# Patient Record
Sex: Male | Born: 1960 | Race: White | Hispanic: No | Marital: Married | Smoking: Never smoker
Health system: Southern US, Community
[De-identification: ages and names within clinical notes are randomized; demographics above are authoritative.]

## PROBLEM LIST (undated history)

## (undated) DIAGNOSIS — U071 COVID-19: Secondary | ICD-10-CM

## (undated) DIAGNOSIS — E785 Hyperlipidemia, unspecified: Secondary | ICD-10-CM

## (undated) DIAGNOSIS — K4090 Unilateral inguinal hernia, without obstruction or gangrene, not specified as recurrent: Secondary | ICD-10-CM

## (undated) DIAGNOSIS — R0902 Hypoxemia: Secondary | ICD-10-CM

## (undated) HISTORY — DX: Unilateral inguinal hernia, without obstruction or gangrene, not specified as recurrent: K40.90

## (undated) HISTORY — DX: COVID-19: U07.1

## (undated) HISTORY — DX: Hypoxemia: R09.02

## (undated) HISTORY — DX: Hyperlipidemia, unspecified: E78.5

---

## 2001-08-28 DIAGNOSIS — I4891 Unspecified atrial fibrillation: Secondary | ICD-10-CM

## 2001-08-28 HISTORY — PX: CARDIAC CATHETERIZATION: SHX172

## 2001-08-28 HISTORY — DX: Unspecified atrial fibrillation: I48.91

## 2001-11-05 ENCOUNTER — Inpatient Hospital Stay (HOSPITAL_COMMUNITY): Admission: AD | Admit: 2001-11-05 | Discharge: 2001-11-07 | Payer: Self-pay | Admitting: Cardiology

## 2010-09-30 LAB — HM COLONOSCOPY

## 2016-11-19 DIAGNOSIS — M79672 Pain in left foot: Secondary | ICD-10-CM | POA: Diagnosis not present

## 2017-03-20 DIAGNOSIS — Z1211 Encounter for screening for malignant neoplasm of colon: Secondary | ICD-10-CM | POA: Diagnosis not present

## 2017-03-20 DIAGNOSIS — I480 Paroxysmal atrial fibrillation: Secondary | ICD-10-CM | POA: Diagnosis not present

## 2017-03-20 DIAGNOSIS — E782 Mixed hyperlipidemia: Secondary | ICD-10-CM | POA: Diagnosis not present

## 2018-07-29 DIAGNOSIS — Z6828 Body mass index (BMI) 28.0-28.9, adult: Secondary | ICD-10-CM | POA: Diagnosis not present

## 2018-07-29 DIAGNOSIS — Z125 Encounter for screening for malignant neoplasm of prostate: Secondary | ICD-10-CM | POA: Diagnosis not present

## 2018-07-29 DIAGNOSIS — Z0000 Encounter for general adult medical examination without abnormal findings: Secondary | ICD-10-CM | POA: Diagnosis not present

## 2018-09-04 DIAGNOSIS — L821 Other seborrheic keratosis: Secondary | ICD-10-CM | POA: Diagnosis not present

## 2018-09-04 DIAGNOSIS — L570 Actinic keratosis: Secondary | ICD-10-CM | POA: Diagnosis not present

## 2018-09-04 DIAGNOSIS — L578 Other skin changes due to chronic exposure to nonionizing radiation: Secondary | ICD-10-CM | POA: Diagnosis not present

## 2018-09-04 DIAGNOSIS — L820 Inflamed seborrheic keratosis: Secondary | ICD-10-CM | POA: Diagnosis not present

## 2019-11-06 ENCOUNTER — Observation Stay (HOSPITAL_COMMUNITY): Payer: Commercial Managed Care - PPO

## 2019-11-06 ENCOUNTER — Inpatient Hospital Stay (HOSPITAL_COMMUNITY)
Admission: EM | Admit: 2019-11-06 | Discharge: 2019-11-08 | DRG: 177 | Disposition: A | Discharge status: Home / Self Care | Payer: Commercial Managed Care - PPO | Attending: Family Medicine | Admitting: Family Medicine

## 2019-11-06 ENCOUNTER — Emergency Department (HOSPITAL_COMMUNITY): Payer: Commercial Managed Care - PPO

## 2019-11-06 ENCOUNTER — Encounter (HOSPITAL_COMMUNITY): Payer: Self-pay | Admitting: Family Medicine

## 2019-11-06 DIAGNOSIS — U071 COVID-19: Secondary | ICD-10-CM

## 2019-11-06 DIAGNOSIS — R011 Cardiac murmur, unspecified: Secondary | ICD-10-CM | POA: Diagnosis present

## 2019-11-06 DIAGNOSIS — J9601 Acute respiratory failure with hypoxia: Secondary | ICD-10-CM | POA: Diagnosis present

## 2019-11-06 DIAGNOSIS — Z79899 Other long term (current) drug therapy: Secondary | ICD-10-CM

## 2019-11-06 DIAGNOSIS — J1282 Pneumonia due to coronavirus disease 2019: Secondary | ICD-10-CM

## 2019-11-06 DIAGNOSIS — N179 Acute kidney failure, unspecified: Secondary | ICD-10-CM | POA: Diagnosis present

## 2019-11-06 DIAGNOSIS — Z7982 Long term (current) use of aspirin: Secondary | ICD-10-CM

## 2019-11-06 DIAGNOSIS — R0902 Hypoxemia: Secondary | ICD-10-CM | POA: Insufficient documentation

## 2019-11-06 HISTORY — DX: Pneumonia due to coronavirus disease 2019: J12.82

## 2019-11-06 HISTORY — DX: COVID-19: U07.1

## 2019-11-06 LAB — COMPREHENSIVE METABOLIC PANEL
ALT: 17 U/L (ref 0–44)
AST: 28 U/L (ref 15–41)
Albumin: 2.8 g/dL — ABNORMAL LOW (ref 3.5–5.0)
Alkaline Phosphatase: 58 U/L (ref 38–126)
Anion gap: 11 (ref 5–15)
BUN: 19 mg/dL (ref 6–20)
CO2: 22 mmol/L (ref 22–32)
Calcium: 8.2 mg/dL — ABNORMAL LOW (ref 8.9–10.3)
Chloride: 102 mmol/L (ref 98–111)
Creatinine, Ser: 1.09 mg/dL (ref 0.61–1.24)
GFR calc Af Amer: >60 mL/min (ref >60)
GFR calc non Af Amer: >60 mL/min (ref >60)
Glucose, Bld: 130 mg/dL — ABNORMAL HIGH (ref 70–99)
Potassium: 3.9 mmol/L (ref 3.5–5.1)
Sodium: 135 mmol/L (ref 135–145)
Total Bilirubin: 1.2 mg/dL (ref 0.3–1.2)
Total Protein: 6.1 g/dL — ABNORMAL LOW (ref 6.5–8.1)

## 2019-11-06 LAB — CBC WITH DIFFERENTIAL/PLATELET
Abs Immature Granulocytes: 0.07 10*3/uL (ref 0.00–0.07)
Basophils Absolute: 0 10*3/uL (ref 0.0–0.1)
Basophils Relative: 0 %
Eosinophils Absolute: 0 10*3/uL (ref 0.0–0.5)
Eosinophils Relative: 0 %
HCT: 39.9 % (ref 39.0–52.0)
Hemoglobin: 13.7 g/dL (ref 13.0–17.0)
Immature Granulocytes: 1 %
Lymphocytes Relative: 6 %
Lymphs Abs: 0.6 10*3/uL — ABNORMAL LOW (ref 0.7–4.0)
MCH: 30 pg (ref 26.0–34.0)
MCHC: 34.3 g/dL (ref 30.0–36.0)
MCV: 87.3 fL (ref 80.0–100.0)
Monocytes Absolute: 0.6 10*3/uL (ref 0.1–1.0)
Monocytes Relative: 6 %
Neutro Abs: 8.5 10*3/uL — ABNORMAL HIGH (ref 1.7–7.7)
Neutrophils Relative %: 87 %
Platelets: 305 10*3/uL (ref 150–400)
RBC: 4.57 MIL/uL (ref 4.22–5.81)
RDW: 12.7 % (ref 11.5–15.5)
WBC: 9.7 10*3/uL (ref 4.0–10.5)
nRBC: 0 % (ref 0.0–0.2)

## 2019-11-06 LAB — TRIGLYCERIDES: Triglycerides: 118 mg/dL (ref <150)

## 2019-11-06 LAB — LACTIC ACID, PLASMA: Lactic Acid, Venous: 1.1 mmol/L (ref 0.5–1.9)

## 2019-11-06 LAB — POC SARS CORONAVIRUS 2 AG -  ED: SARS Coronavirus 2 Ag: NEGATIVE

## 2019-11-06 LAB — C-REACTIVE PROTEIN: CRP: 23.1 mg/dL — ABNORMAL HIGH (ref <1.0)

## 2019-11-06 LAB — FERRITIN: Ferritin: 635 ng/mL — ABNORMAL HIGH (ref 24–336)

## 2019-11-06 LAB — D-DIMER, QUANTITATIVE: D-Dimer, Quant: 1.07 ug/mL-FEU — ABNORMAL HIGH (ref 0.00–0.50)

## 2019-11-06 LAB — RESPIRATORY PANEL BY RT PCR (FLU A&B, COVID)
Influenza A by PCR: NEGATIVE
Influenza B by PCR: NEGATIVE
SARS Coronavirus 2 by RT PCR: POSITIVE — AB

## 2019-11-06 LAB — BRAIN NATRIURETIC PEPTIDE: B Natriuretic Peptide: 72.9 pg/mL (ref 0.0–100.0)

## 2019-11-06 LAB — HIV ANTIBODY (ROUTINE TESTING W REFLEX): HIV Screen 4th Generation wRfx: NONREACTIVE

## 2019-11-06 LAB — ABO/RH: ABO/RH(D): O POS

## 2019-11-06 LAB — PROCALCITONIN: Procalcitonin: 0.15 ng/mL

## 2019-11-06 LAB — TROPONIN I (HIGH SENSITIVITY)
Troponin I (High Sensitivity): 8 ng/L (ref <18)
Troponin I (High Sensitivity): 8 ng/L (ref <18)

## 2019-11-06 LAB — LACTATE DEHYDROGENASE: LDH: 281 U/L — ABNORMAL HIGH (ref 98–192)

## 2019-11-06 LAB — FIBRINOGEN: Fibrinogen: >800 mg/dL — ABNORMAL HIGH (ref 210–475)

## 2019-11-06 MED ORDER — DEXAMETHASONE 6 MG PO TABS
6.0000 mg | ORAL_TABLET | ORAL | Status: DC
  Administered 2019-11-06 – 2019-11-07 (×2): 6 mg via ORAL
  Filled 2019-11-06: qty 2
  Filled 2019-11-06: qty 1

## 2019-11-06 MED ORDER — POLYETHYLENE GLYCOL 3350 17 G PO PACK: 17.0000 g | PACK | Freq: Every day | ORAL | Status: DC | PRN

## 2019-11-06 MED ORDER — SODIUM CHLORIDE 0.9 % IV SOLN
INTRAVENOUS | Status: AC
  Administered 2019-11-06: 100 mL/h via INTRAVENOUS

## 2019-11-06 MED ORDER — SODIUM CHLORIDE 0.9 % IV SOLN: 200.0000 mg | Freq: Once | INTRAVENOUS | Status: DC

## 2019-11-06 MED ORDER — SODIUM CHLORIDE 0.9 % IV SOLN: 100.0000 mg | Freq: Every day | INTRAVENOUS | Status: DC

## 2019-11-06 MED ORDER — ACETAMINOPHEN 500 MG PO TABS
1000.0000 mg | ORAL_TABLET | Freq: Once | ORAL | Status: AC
  Administered 2019-11-06: 1000 mg via ORAL
  Filled 2019-11-06: qty 2

## 2019-11-06 MED ORDER — KETOROLAC TROMETHAMINE 15 MG/ML IJ SOLN
15.0000 mg | Freq: Once | INTRAMUSCULAR | Status: AC
  Administered 2019-11-06: 18:00:00 15 mg via INTRAVENOUS
  Filled 2019-11-06: qty 1

## 2019-11-06 MED ORDER — SODIUM CHLORIDE 0.9 % IV SOLN
100.0000 mg | Freq: Every day | INTRAVENOUS | Status: DC
  Administered 2019-11-07 – 2019-11-08 (×2): 100 mg via INTRAVENOUS
  Filled 2019-11-06 (×3): qty 20

## 2019-11-06 MED ORDER — ACETAMINOPHEN 325 MG PO TABS: 650.0000 mg | ORAL_TABLET | Freq: Four times a day (QID) | ORAL | Status: DC | PRN

## 2019-11-06 MED ORDER — SODIUM CHLORIDE 0.9 % IV SOLN
100.0000 mg | INTRAVENOUS | Status: AC
  Administered 2019-11-06: 20:00:00 100 mg via INTRAVENOUS
  Filled 2019-11-06 (×6): qty 20

## 2019-11-06 MED ORDER — HYDROCOD POLST-CPM POLST ER 10-8 MG/5ML PO SUER
5.0000 mL | Freq: Once | ORAL | Status: AC
  Administered 2019-11-06: 5 mL via ORAL
  Filled 2019-11-06: qty 5

## 2019-11-06 MED ORDER — IOHEXOL 350 MG/ML SOLN
75.0000 mL | Freq: Once | INTRAVENOUS | Status: AC | PRN
  Administered 2019-11-06: 75 mL via INTRAVENOUS

## 2019-11-06 MED ORDER — ENOXAPARIN SODIUM 40 MG/0.4ML ~~LOC~~ SOLN
40.0000 mg | SUBCUTANEOUS | Status: DC
  Administered 2019-11-06 – 2019-11-07 (×2): 40 mg via SUBCUTANEOUS
  Filled 2019-11-06 (×2): qty 0.4

## 2019-11-06 NOTE — ED Notes (Signed)
Updated pt's wife, Vaughan Basta, on pts condition.

## 2019-11-06 NOTE — H&P (Addendum)
[facility] Admission History and Physical Service Pager: [phone]  Patient name: Robert Arroyo Medical record number: [id number] Date of birth: Sep 19, 1960 Age: 59 y.o. Gender: male  Primary Care Provider: Rochel Brome, MD Consultants: None Code Status: Full  Preferred Emergency Contact: Balinda Quails, [phone]  Chief Complaint: SOB  Assessment and Plan: Robert Arroyo is a 59 y.o. male presenting with SOB and known COVID-19 infection. PMH is significant for heart murmur on metoprolol per patient report.  Acute hypoxic respiratory failure 2/2 COVID-19 Pneumonia Diagnosed positive on 3/8. Has been having intermittent fevers and coughing for a few weeks.  T max 103 at home.  Over the last 24 hrs has had worsening shortness of breath, describes it as a rather sudden onset.  Called EMS, who found patient to by hypoxic to 80s and was placed on 4L and brought to ED.  Febrile to 102 in ED with mild tachypnea to 20s.  Has been on RA satting at 88-low 90s in ED, has refused O2 at this time.  HR in 70s to 80s.  CXR bilateral parenchymal opacities consistent with COVID-19.  Covid labs with LDH 281, ferritin 635, CRP 23.1, D-dimer 1.07, fibrinogen get greater than 800.  Blood cultures collected.  Discussed with attending Dr. Erin Hearing, the given patient has sudden onset shortness of breath, new oxygen requirement, and odd history of intermittent fevers for a few weeks, with just now developing sudden onset shortness of breath, will obtain CTA to rule out PE given hypercoagulable state of COVID-19.  Given his new oxygen requirement will start patient on Decadron as well as remdesivir.  We will also give patient gentle fluids for 10 hours given that he has had poor p.o. intake for the last 24 hours.  Hopefully patient will be able to ambulate and maintain proper saturation tomorrow, as he would like to go home tomorrow. -Admit to observation, Dr. Erin Hearing attending -Vitals  per floor protocol -Continuous pulse ox -Cardiac monitoring -Ambulate with pulse ox in a.m. -PT/OT eval -Remdesivir (3/11- ) -Decadron (3/11- ) -Tylenol as needed pain and fever - MiraLAX as needed constipation -A.m. CMP/CBC -A.m. D-dimer and CRP for trending -CTA -f/u blood cxs - 100cc/hr NS x10 hrs given poor PO x 1 day -Lovenox for DVT prophylaxis, pharmacy consulted for COVID-19 dosing -Regular diet -Encourage O2 to maintain sats greater than 92%  Chest Pain Patient endorses intermittent chest pain that seems to require more with coughing, but unclear unsure nature of this.  Seems to have occurred in the last few days.  Does not seem pleuritic in nature.  NSR, no obvious ST elevations or depressions, slightly inverted T waves in inferior leads, per ED provider improved from previous, but do not see other to compare.  Will obtain troponins to rule out cardiac nature of chest pain.  Also getting CTA per above.  Could be MSK related secondary to coughing, but patient is not tender to palpation on exam. -Cardiac monitoring -Trend troponins  Heart Murmur Patient reports a history of heart murmur which she states is the reason he is on metoprolol.  States that he was diagnosed with this many years ago.  No heart murmur auscultated on examination today.  Patient does not appear to be fluid overloaded and denies a history of heart failure.  Will obtain a BNP to further assess.  Chest x-ray does not appear consistent with pulmonary edema.  We will hold off on metoprolol at this time, but can restart in the a.m. if  needed.   - obtain BNP - holding metoprolol this PM, can reassess in AM  FEN/GI: regular Prophylaxis: Lovenox  Disposition: place in obs, pending PT/OT, but suspect patient will likely d/c home when medically stable  History of Present Illness:  Robert Arroyo is a 59 y.o. male presenting with sudden onset shortness of breath in the setting of positive COVID-19 pneumonia on  3/8.  Patient reports a 2 to 3-week history of intermittent fevers and coughing.  T-max per his report 103.  States that he has been breathing comfortably on room air until the last 24 hours when he suddenly had difficulty breathing.  Progressively worsened and this a.m. called EMS.  Was found to be hypoxic to the 80s and was placed on 4 L.  Upon arrival at the ED, patient found to be febrile to 102, vitals were otherwise stable aside from slight hypoxia to high 80s.  Given his oxygen requirement, ED called for admission.  In the ED, patient was endorsing some intermittent chest pain/tightness in the center to left side of his chest that he stated was because of his cough, but not worsened by his cough specifically.  Denied pleuritic chest pain.  Denied orthopnea.  Stated that the pain would come and go, does not endorse it presently.  Was declining oxygen at the time of examination.  Review Of Systems: Per HPI with the following additions:   Review of Systems  Constitutional: Positive for chills, diaphoresis and fever.  HENT: Positive for congestion.   Respiratory: Positive for cough and shortness of breath.   Cardiovascular: Positive for chest pain. Negative for orthopnea.  Gastrointestinal: Positive for nausea. Negative for abdominal pain, blood in stool, constipation, diarrhea, melena and vomiting.  Genitourinary: Negative for dysuria and urgency.  Neurological: Negative for focal weakness.    Patient Active Problem List   Diagnosis Date Noted  . Pneumonia due to COVID-19 virus 11/06/2019  . Hypoxia     Past Medical History: No past medical history on file.  Past Surgical History: No surgeries on file.  Social History: Social History   Tobacco Use  . Smoking status: Not on file  Substance Use Topics  . Alcohol use: Not on file  . Drug use: Not on file   Additional social history:   Please also refer to relevant sections of EMR.  Family History: No family history on  file. No clear family history at this time.  Will re-assess.  Allergies and Medications: No Known Allergies No current facility-administered medications on file prior to encounter.   Current Outpatient Medications on File Prior to Encounter  Medication Sig Dispense Refill  . acetaminophen (TYLENOL) 500 MG tablet Take 500-1,000 mg by mouth every 6 (six) hours as needed for fever (malaise, or pain).    Marland Kitchen aspirin EC 81 MG tablet Take 81 mg by mouth daily.    Marland Kitchen azelastine (ASTELIN) 0.1 % nasal spray Place 2 sprays into both nostrils 2 (two) times daily.    . benzonatate (TESSALON) 200 MG capsule Take 200 mg by mouth 3 (three) times daily as needed for cough.     Marland Kitchen ibuprofen (ADVIL) 200 MG tablet Take 200-400 mg by mouth every 6 (six) hours as needed for fever (malaise, or pain).    . metoprolol succinate (TOPROL-XL) 25 MG 24 hr tablet Take 25 mg by mouth daily.      Objective: BP 121/71   Pulse 83   Temp (!) 102 F (38.9 C) (Oral)   Resp Marland Kitchen)  26   Ht 5\' 7"  (1.702 m)   Wt 77.1 kg   SpO2 94%   BMI 26.63 kg/m   Physical Exam:  General: 59 y.o. male , diaphoretic  Cardio: RRR no m/r/g, no TTP chest wall Lungs: diffuse, scattered crackles b/l lung fields, no increased WOB on RA Abdomen: Soft, non-tender to palpation, non-distended Skin: warm and dry, mildly erythematous and perspiring  Extremities: No edema Neuro: A&Ox4 Psych: mood and affect appropriate for circumstance   Labs and Imaging: CBC BMET  Recent Labs  Lab 11/06/19 1637  WBC 9.7  HGB 13.7  HCT 39.9  PLT 305   Recent Labs  Lab 11/06/19 1637  NA 135  K 3.9  CL 102  CO2 22  BUN 19  CREATININE 1.09  GLUCOSE 130*  CALCIUM 8.2*     EKG: NSR, no obvious ST elevations or depressions, slightly inverted T waves in inferior leads, per ED provider improved from previous, but do not see other to compare  [facility] Chest Port 1 View  Result Date: 11/06/2019 CLINICAL DATA:  Shortness of breath, positive COVID-19 EXAM:  PORTABLE CHEST 1 VIEW COMPARISON:  11/05/2019 FINDINGS: Cardiac shadow is stable from the prior exam from the previous day. Lungs are well aerated bilaterally. Diffuse bilateral opacities are noted consistent with the patient's given clinical history of COVID-19 positivity. IMPRESSION: Bilateral parenchymal opacities consistent with the given clinical history of COVID-19 positivity. The degree of opacities have increased slightly in the interval from the prior plain film. Electronically Signed   By: Inez Catalina M.D.   On: 11/06/2019 17:06    Karalina Tift, Bernita Raisin, DO 11/06/2019, 8:00 PM PGY-2, [place] [facility] pager: [phone], text pages welcome

## 2019-11-06 NOTE — ED Provider Notes (Signed)
[place] EMERGENCY DEPARTMENT Provider Note   CSN: [id number] Arrival date & time: 11/06/19  1550     History Chief Complaint  Patient presents with  . Shortness of Breath    COVID positive     Robert Arroyo is a 59 y.o. male.  59 yo M with a chief complaints of shortness of breath.  Patient has had cough shortness of breath and fever for about 2 weeks now.  Worsening trouble breathing over the past few days.  Got significantly worse and so he decided to come to the hospital.  Denies continued nausea vomiting or diarrhea.  Denies abdominal pain.  85% on room air at home with started on 4 L of oxygen in route with EMS.  The history is provided by the patient.  Shortness of Breath Severity:  Severe Onset quality:  Gradual Duration:  2 weeks Timing:  Constant Progression:  Worsening Chronicity:  New Relieved by:  Nothing Worsened by:  Nothing Ineffective treatments:  None tried Associated symptoms: cough and fever   Associated symptoms: no abdominal pain, no chest pain, no headaches, no rash and no vomiting        History reviewed. No pertinent past medical history.  Patient Active Problem List   Diagnosis Date Noted  . Pneumonia due to COVID-19 virus 11/06/2019  . Hypoxia     History reviewed. No pertinent surgical history.     No family history on file.  Social History   Tobacco Use  . Smoking status: Not on file  Substance Use Topics  . Alcohol use: Not on file  . Drug use: Not on file    Home Medications Prior to Admission medications   Medication Sig Start Date End Date Taking? Authorizing Provider  acetaminophen (TYLENOL) 500 MG tablet Take 500-1,000 mg by mouth every 6 (six) hours as needed for fever (malaise, or pain).   Yes [provider]  aspirin EC 81 MG tablet Take 81 mg by mouth daily.   Yes [provider]  azelastine (ASTELIN) 0.1 % nasal spray Place 2 sprays into both nostrils 2 (two) times daily.  11/03/19  Yes [provider]  benzonatate (TESSALON) 200 MG capsule Take 200 mg by mouth 3 (three) times daily as needed for cough.  11/03/19  Yes [provider]  ibuprofen (ADVIL) 200 MG tablet Take 200-400 mg by mouth every 6 (six) hours as needed for fever (malaise, or pain).   Yes [provider]  metoprolol succinate (TOPROL-XL) 25 MG 24 hr tablet Take 25 mg by mouth daily. 09/03/19  Yes [provider]    Allergies    Patient has no known allergies.  Review of Systems   Review of Systems  Constitutional: Positive for chills and fever.  HENT: Negative for congestion and facial swelling.   Eyes: Negative for discharge and visual disturbance.  Respiratory: Positive for cough and shortness of breath.   Cardiovascular: Negative for chest pain and palpitations.  Gastrointestinal: Negative for abdominal pain, diarrhea and vomiting.  Musculoskeletal: Positive for myalgias. Negative for arthralgias.  Skin: Negative for color change and rash.  Neurological: Negative for tremors, syncope and headaches.  Psychiatric/Behavioral: Negative for confusion and dysphoric mood.    Physical Exam Updated Vital Signs BP 115/68   Pulse 72   Temp (!) 97.4 F (36.3 C)   Resp (!) 24   Ht 5\' 7"  (1.702 m)   Wt 77.1 kg   SpO2 95%   BMI 26.63 kg/m  Physical Exam Vitals and nursing note reviewed.  Constitutional:      Appearance: He is well-developed.  HENT:     Head: Normocephalic and atraumatic.  Eyes:     Pupils: Pupils are equal, round, and reactive to light.  Neck:     Vascular: No JVD.  Cardiovascular:     Rate and Rhythm: Normal rate and regular rhythm.     Heart sounds: No murmur. No friction rub. No gallop.   Pulmonary:     Effort: No respiratory distress.     Breath sounds: No wheezing.  Abdominal:     General: There is no distension.     Tenderness: There is no guarding or rebound.  Musculoskeletal:        General: Normal range of motion.      Cervical back: Normal range of motion and neck supple.  Skin:    Coloration: Skin is not pale.     Findings: No rash.  Neurological:     Mental Status: He is alert and oriented to person, place, and time.  Psychiatric:        Behavior: Behavior normal.     ED Results / Procedures / Treatments   Labs (all labs ordered are listed, but only abnormal results are displayed) Labs Reviewed  RESPIRATORY PANEL BY RT PCR (FLU A&B, COVID) - Abnormal; Notable for the following components:      Result Value   SARS Coronavirus 2 by RT PCR POSITIVE (*)    All other components within normal limits  CBC WITH DIFFERENTIAL/PLATELET - Abnormal; Notable for the following components:   Neutro Abs 8.5 (*)    Lymphs Abs 0.6 (*)    All other components within normal limits  COMPREHENSIVE METABOLIC PANEL - Abnormal; Notable for the following components:   Glucose, Bld 130 (*)    Calcium 8.2 (*)    Total Protein 6.1 (*)    Albumin 2.8 (*)    All other components within normal limits  D-DIMER, QUANTITATIVE (NOT AT [facility]) - Abnormal; Notable for the following components:   D-Dimer, Quant 1.07 (*)    All other components within normal limits  LACTATE DEHYDROGENASE - Abnormal; Notable for the following components:   LDH 281 (*)    All other components within normal limits  FERRITIN - Abnormal; Notable for the following components:   Ferritin 635 (*)    All other components within normal limits  FIBRINOGEN - Abnormal; Notable for the following components:   Fibrinogen >800 (*)    All other components within normal limits  C-REACTIVE PROTEIN - Abnormal; Notable for the following components:   CRP 23.1 (*)    All other components within normal limits  CULTURE, BLOOD (ROUTINE X 2)  CULTURE, BLOOD (ROUTINE X 2)  LACTIC ACID, PLASMA  PROCALCITONIN  TRIGLYCERIDES  HIV ANTIBODY (ROUTINE TESTING W REFLEX)  BRAIN NATRIURETIC PEPTIDE  CBC  D-DIMER, QUANTITATIVE (NOT AT [facility])  C-REACTIVE PROTEIN    COMPREHENSIVE METABOLIC PANEL  POC SARS CORONAVIRUS 2 AG -  ED  ABO/RH  TROPONIN I (HIGH SENSITIVITY)  TROPONIN I (HIGH SENSITIVITY)    EKG EKG Interpretation  Date/Time:  Thursday November 06 2019 16:11:37 EST Ventricular Rate:  89 PR Interval:    QRS Duration: 99 QT Interval:  343 QTC Calculation: 418 R Axis:   57 Text Interpretation: Sinus rhythm Borderline T abnormalities, inferior leads deep inverted t waves seen on prior resolved Otherwise no significant change Confirmed by Deno Etienne [phone]) on 11/06/2019 4:51:58  PM   Radiology CT ANGIO CHEST PE W OR WO CONTRAST  Result Date: 11/06/2019 CLINICAL DATA:  Shortness of breath and history of COVID-19 positivity EXAM: CT ANGIOGRAPHY CHEST WITH CONTRAST TECHNIQUE: Multidetector CT imaging of the chest was performed using the standard protocol during bolus administration of intravenous contrast. Multiplanar CT image reconstructions and MIPs were obtained to evaluate the vascular anatomy. CONTRAST:  35mL OMNIPAQUE IOHEXOL 350 MG/ML SOLN COMPARISON:  CT from the previous day. FINDINGS: Cardiovascular: Thoracic aorta shows no aneurysmal dilatation or dissection. No cardiac enlargement is noted. Pulmonary artery is well visualized within normal branching pattern. No intraluminal filling defect to suggest pulmonary embolism is seen. No coronary calcifications are noted. Mediastinum/Nodes: This thoracic inlet is within normal limits. Scattered small mediastinal lymph nodes are noted likely reactive in nature. The esophagus is within normal limits. Lungs/Pleura: Diffuse bilateral ground-glass infiltrates are noted bilaterally with some is increase in the degree of consolidation in the bases bilaterally. To these changes are consistent with the given clinical history of COVID-19 positivity. No sizable effusion is seen. Upper Abdomen: Visualized upper abdomen is within normal limits. Musculoskeletal: Degenerative changes of the thoracic spine are noted.  Review of the MIP images confirms the above findings. IMPRESSION: No evidence of pulmonary emboli. Changes consistent with COVID-19 positivity. Electronically Signed   By: Inez Catalina M.D.   On: 11/06/2019 22:41   DG Chest Port 1 View  Result Date: 11/06/2019 CLINICAL DATA:  Shortness of breath, positive COVID-19 EXAM: PORTABLE CHEST 1 VIEW COMPARISON:  11/05/2019 FINDINGS: Cardiac shadow is stable from the prior exam from the previous day. Lungs are well aerated bilaterally. Diffuse bilateral opacities are noted consistent with the patient's given clinical history of COVID-19 positivity. IMPRESSION: Bilateral parenchymal opacities consistent with the given clinical history of COVID-19 positivity. The degree of opacities have increased slightly in the interval from the prior plain film. Electronically Signed   By: Inez Catalina M.D.   On: 11/06/2019 17:06    Procedures Procedures (including critical care time)  Medications Ordered in ED Medications  0.9 %  sodium chloride infusion (100 mL/hr Intravenous New Bag/Given 11/06/19 2023)  acetaminophen (TYLENOL) tablet 650 mg (has no administration in time range)  dexamethasone (DECADRON) tablet 6 mg (6 mg Oral Given 11/06/19 2019)  polyethylene glycol (MIRALAX / GLYCOLAX) packet 17 g (has no administration in time range)  remdesivir 100 mg in sodium chloride 0.9 % 100 mL IVPB (0 mg Intravenous Stopped 11/06/19 2112)    Followed by  remdesivir 100 mg in sodium chloride 0.9 % 100 mL IVPB (has no administration in time range)  enoxaparin (LOVENOX) injection 40 mg (40 mg Subcutaneous Given 11/06/19 2023)  acetaminophen (TYLENOL) tablet 1,000 mg (1,000 mg Oral Given 11/06/19 1738)  ketorolac (TORADOL) 15 MG/ML injection 15 mg (15 mg Intravenous Given 11/06/19 1739)  chlorpheniramine-HYDROcodone (TUSSIONEX) 10-8 MG/5ML suspension 5 mL (5 mLs Oral Given 11/06/19 1738)  iohexol (OMNIPAQUE) 350 MG/ML injection 75 mL (75 mLs Intravenous Contrast Given 11/06/19 2226)     ED Course  I have reviewed the triage vital signs and the nursing notes.  Pertinent labs & imaging results that were available during my care of the patient were reviewed by me and considered in my medical decision making (see chart for details).    MDM Rules/Calculators/A&P                      59 yo M with chief complaints of having the novel coronavirus.  Having worsening trouble breathing over the past few days.  Found to be hypoxic and brought to the ED.  Will obtain lab work per protocol for likely admission.  He was 88% on room air when I initially evaluated him.  Doing well on 2 L of oxygen.  CRITICAL CARE Performed by: Cecilio Asper   Total critical care time: 35 minutes  Critical care time was exclusive of separately billable procedures and treating other patients.  Critical care was necessary to treat or prevent imminent or life-threatening deterioration.  Critical care was time spent personally by me on the following activities: development of treatment plan with patient and/or surrogate as well as nursing, discussions with consultants, evaluation of patient's response to treatment, examination of patient, obtaining history from patient or surrogate, ordering and performing treatments and interventions, ordering and review of laboratory studies, ordering and review of radiographic studies, pulse oximetry and re-evaluation of patient's condition.  The patients results and plan were reviewed and discussed.   Any x-rays performed were independently reviewed by myself.   Differential diagnosis were considered with the presenting HPI.  Medications  0.9 %  sodium chloride infusion (100 mL/hr Intravenous New Bag/Given 11/06/19 2023)  acetaminophen (TYLENOL) tablet 650 mg (has no administration in time range)  dexamethasone (DECADRON) tablet 6 mg (6 mg Oral Given 11/06/19 2019)  polyethylene glycol (MIRALAX / GLYCOLAX) packet 17 g (has no administration in time range)    remdesivir 100 mg in sodium chloride 0.9 % 100 mL IVPB (0 mg Intravenous Stopped 11/06/19 2112)    Followed by  remdesivir 100 mg in sodium chloride 0.9 % 100 mL IVPB (has no administration in time range)  enoxaparin (LOVENOX) injection 40 mg (40 mg Subcutaneous Given 11/06/19 2023)  acetaminophen (TYLENOL) tablet 1,000 mg (1,000 mg Oral Given 11/06/19 1738)  ketorolac (TORADOL) 15 MG/ML injection 15 mg (15 mg Intravenous Given 11/06/19 1739)  chlorpheniramine-HYDROcodone (TUSSIONEX) 10-8 MG/5ML suspension 5 mL (5 mLs Oral Given 11/06/19 1738)  iohexol (OMNIPAQUE) 350 MG/ML injection 75 mL (75 mLs Intravenous Contrast Given 11/06/19 2226)    Vitals:   11/06/19 1745 11/06/19 2000 11/06/19 2200 11/06/19 2236  BP: 121/71  98/65 115/68  Pulse: 83  62 72  Resp: (!) 26  14 (!) 24  Temp:  (!) 97.4 F (36.3 C)    TempSrc:      SpO2:   94% 95%  Weight:      Height:        Final diagnoses:  Pneumonia due to COVID-19 virus    Admission/ observation were discussed with the admitting physician, patient and/or family and they are comfortable with the plan.    Final Clinical Impression(s) / ED Diagnoses Final diagnoses:  Pneumonia due to COVID-19 virus    Rx / DC Orders ED Discharge Orders    None       Deno Etienne, DO 11/06/19 2301

## 2019-11-06 NOTE — ED Triage Notes (Signed)
Pt to ED via EMS from home, COVID positive result on Monday, c/o worsening SHOB, Febrile, does not normally require O2, hx HTN. Temp 100.1, 95% 4l,. HR 88, RR 18, BP 129.74. CBG 113. No medications given by EMS.

## 2019-11-07 DIAGNOSIS — J1282 Pneumonia due to coronavirus disease 2019: Secondary | ICD-10-CM | POA: Diagnosis present

## 2019-11-07 DIAGNOSIS — U071 COVID-19: Secondary | ICD-10-CM | POA: Diagnosis present

## 2019-11-07 DIAGNOSIS — Z79899 Other long term (current) drug therapy: Secondary | ICD-10-CM | POA: Diagnosis not present

## 2019-11-07 DIAGNOSIS — R011 Cardiac murmur, unspecified: Secondary | ICD-10-CM | POA: Diagnosis present

## 2019-11-07 DIAGNOSIS — Z7982 Long term (current) use of aspirin: Secondary | ICD-10-CM | POA: Diagnosis not present

## 2019-11-07 DIAGNOSIS — N179 Acute kidney failure, unspecified: Secondary | ICD-10-CM | POA: Diagnosis present

## 2019-11-07 DIAGNOSIS — J9601 Acute respiratory failure with hypoxia: Secondary | ICD-10-CM | POA: Diagnosis present

## 2019-11-07 LAB — COMPREHENSIVE METABOLIC PANEL
ALT: 16 U/L (ref 0–44)
AST: 24 U/L (ref 15–41)
Albumin: 2.5 g/dL — ABNORMAL LOW (ref 3.5–5.0)
Alkaline Phosphatase: 54 U/L (ref 38–126)
Anion gap: 10 (ref 5–15)
BUN: 22 mg/dL — ABNORMAL HIGH (ref 6–20)
CO2: 26 mmol/L (ref 22–32)
Calcium: 8.1 mg/dL — ABNORMAL LOW (ref 8.9–10.3)
Chloride: 100 mmol/L (ref 98–111)
Creatinine, Ser: 1.38 mg/dL — ABNORMAL HIGH (ref 0.61–1.24)
GFR calc Af Amer: >60 mL/min (ref >60)
GFR calc non Af Amer: 55 mL/min — ABNORMAL LOW (ref >60)
Glucose, Bld: 228 mg/dL — ABNORMAL HIGH (ref 70–99)
Potassium: 5.1 mmol/L (ref 3.5–5.1)
Sodium: 136 mmol/L (ref 135–145)
Total Bilirubin: 0.7 mg/dL (ref 0.3–1.2)
Total Protein: 5.9 g/dL — ABNORMAL LOW (ref 6.5–8.1)

## 2019-11-07 LAB — CBC
HCT: 39.5 % (ref 39.0–52.0)
Hemoglobin: 13.2 g/dL (ref 13.0–17.0)
MCH: 30 pg (ref 26.0–34.0)
MCHC: 33.4 g/dL (ref 30.0–36.0)
MCV: 89.8 fL (ref 80.0–100.0)
Platelets: 349 10*3/uL (ref 150–400)
RBC: 4.4 MIL/uL (ref 4.22–5.81)
RDW: 12.8 % (ref 11.5–15.5)
WBC: 8.9 10*3/uL (ref 4.0–10.5)
nRBC: 0 % (ref 0.0–0.2)

## 2019-11-07 LAB — C-REACTIVE PROTEIN: CRP: 23.5 mg/dL — ABNORMAL HIGH (ref <1.0)

## 2019-11-07 LAB — D-DIMER, QUANTITATIVE: D-Dimer, Quant: 1.15 ug/mL-FEU — ABNORMAL HIGH (ref 0.00–0.50)

## 2019-11-07 MED ORDER — SODIUM CHLORIDE 0.9 % IV SOLN: INTRAVENOUS | Status: DC

## 2019-11-07 NOTE — Evaluation (Signed)
Physical Therapy Evaluation Patient Details Name: Robert Arroyo MRN: [id number] DOB: Oct 24, 1960 Today's Date: 11/07/2019   History of Present Illness  59 year old male admitted observation status on 11/06/19 with known +COVID 11/03/19 to ED via EMS due to SOB and hypoxia in the 80s so placed on 4L by EMS. CXR: bilateral parenchymal opacities consistent with COVID-19. CTA negative for PE. Patient also with complaints of intermittent chest pain with unclear nature. Does not seem pleuritic in nature.  NSR, no obvious ST elevations or depressions, slightly inverted T waves in inferior leads, per ED provider improved from previous, but do not see other to compare.  Could be MSK related secondary to coughing, but patient is not tender to palpation on exam. PMH: heart murmur    Clinical Impression  Trial of room air during session. Oxygen saturation >/=90% with mobility, desats to 86-87% with seated rest but not sustained (recovers within 30 seconds or less seated rest). Patient mildly unsteady but no overt LOB. Gait quality improved with use of SPC and patient agreeable to use. If patient discharges in the next day or two, recommend home PT to progress patient back to PLOF of independent with mobility, able to walk miles at his job at baseline.    Follow Up Recommendations Home health PT((if discharges in the next day or two))    Equipment Recommendations  Cane((patient owns cane, recommend use))       Precautions / Restrictions Precautions Precautions: Fall;Other (comment) Precaution Comments: monitor oxygen saturation Restrictions Weight Bearing Restrictions: No      Mobility  Bed Mobility Overal bed mobility: Independent(supine>sit)  Transfers Overall transfer level: Modified independent Equipment used: None;Straight cane General transfer comment: sit>stand from EOB independent, sit<>stand from recliner chair with Stuart modI  Ambulation/Gait Ambulation/Gait assistance: Supervision Gait  Distance (Feet): 125 Feet(30) Assistive device: None;Straight cane Gait Pattern/deviations: Decreased step length - right;Decreased step length - left Gait velocity: decreased, improved with use of SPC compared to no AD   General Gait Details: Patient tends to look down at his feet. Gait trial 1 without AD in hallway approx 158ft on room air with oxygen saturation >/=90% during ambulation, down to 87% once sitting to rest but recovers in less than 30 seconds to >/=90% on room air. Trial 2 with SPC in room with improved gait quality. Oxygen saturation on room air down to 86% after ambulating and negotiating 2 stairs with SPC but recovers with seated rest approx 30 seconds to 92%.  Stairs Stairs: Yes Stairs assistance: Min guard;Min assist Stair Management: No rails;With cane Number of Stairs: 2 General stair comments: Education provided to have someone assist him into the home      Balance Overall balance assessment: Needs assistance Sitting-balance support: Feet supported Sitting balance-Leahy Scale: Normal Sitting balance - Comments: able to don socks sitting EOB independently   Standing balance support: No upper extremity supported Standing balance-Leahy Scale: Good Standing balance comment: Difficulty stepping up onto step, >1 attempt required on 2nd step, difficulty with single leg stance      Pertinent Vitals/Pain Pain Assessment: No/denies pain    Home Living Family/patient expects to be discharged to:: Private residence Living Arrangements: Spouse/significant other Available Help at Discharge: (wife works from home, only has mild symptoms) Type of Home: House Home Access: Stairs to enter Entrance Stairs-Rails: None Technical brewer of Steps: 3 Home Layout: One level Home Equipment: Kasandra Knudsen - single point Additional Comments: patient works full time for [employer], drives, shares IADLs with wife but  wife does most of cooking as she works from home x 1 year     Prior Function Level of Independence: Independent      Comments: has SPC but doesn't use at baseline        Extremity/Trunk Assessment     Lower Extremity Assessment Lower Extremity Assessment: (BLE strength 4+/5)       Communication   Communication: No difficulties  Cognition Arousal/Alertness: Awake/alert Behavior During Therapy: WFL for tasks assessed/performed Overall Cognitive Status: Within Functional Limits for tasks assessed    General Comments General comments (skin integrity, edema, etc.): Patient grossly stable on room air during session. Nurse informed and okay with patient staying on room air.        Assessment/Plan    PT Assessment Patient needs continued PT services  PT Problem List Decreased activity tolerance;Decreased balance;Decreased mobility;Cardiopulmonary status limiting activity       PT Treatment Interventions Gait training;DME instruction;Stair training;Functional mobility training;Therapeutic activities;Therapeutic exercise;Balance training;Patient/family education    PT Goals (Current goals can be found in the Care Plan section)  Acute Rehab PT Goals PT Goal Formulation: With patient Time For Goal Achievement: 11/20/19 Potential to Achieve Goals: Good    Frequency Min 3X/week   Barriers to discharge   none anticipated       AM-PAC PT "6 Clicks" Mobility  Outcome Measure Help needed turning from your back to your side while in a flat bed without using bedrails?: None Help needed moving from lying on your back to sitting on the side of a flat bed without using bedrails?: None Help needed moving to and from a bed to a chair (including a wheelchair)?: None Help needed standing up from a chair using your arms (e.g., wheelchair or bedside chair)?: None Help needed to walk in hospital room?: A Little Help needed climbing 3-5 steps with a railing? : A Little 6 Click Score: 22    End of Session   Activity Tolerance: Patient  tolerated treatment well Patient left: in chair;with call bell/phone within reach;with chair alarm set Nurse Communication: Mobility status(oxygen saturation) PT Visit Diagnosis: Unsteadiness on feet (R26.81);Other abnormalities of gait and mobility (R26.89)    Time: [id number] PT Time Calculation (min) (ACUTE ONLY): 36 min   Charges:   PT Evaluation $PT Eval Moderate Complexity: [place], DPT, PT Acute Rehab [phone] office    Birdie Hopes 11/07/2019, 9:44 AM

## 2019-11-07 NOTE — Plan of Care (Signed)
  Problem: Education: Goal: Knowledge of General Education information will improve Description: Including pain rating scale, medication(s)/side effects and non-pharmacologic comfort measures Outcome: Progressing   Problem: Clinical Measurements: Goal: Ability to maintain clinical measurements within normal limits will improve Outcome: Progressing   

## 2019-11-07 NOTE — Progress Notes (Signed)
Oxygen Saturation  Patient Saturations on Suppl Oxygen at Rest = 96%, HR 84 bpm, RR 17 at rest in bed  Patient Saturations on Room Air at Rest = 88%, HR 85 bpm, RR 25 sitting EOB, 92% sitting in chair at end of session  Patient Saturations on Room Air while Ambulating = 90% Down to 87% with seated rest, recovered to >/=90% in less than 30 seconds Down to 86% after stair negotiation, recovered to 92% within 30 seconds seated rest

## 2019-11-07 NOTE — Progress Notes (Signed)
Occupational Therapy Evaluation Patient Details Name: Robert Arroyo MRN: [id number] DOB: [birth year] Today's Date: 11/07/2019    History of Present Illness 59 year old male admitted observation status on 11/06/19 with known +COVID 11/03/19 to ED via EMS due to SOB and hypoxia in the 80s so placed on 4L by EMS. CXR: bilateral parenchymal opacities consistent with COVID-19. CTA negative for PE. Patient also with complaints of intermittent chest pain with unclear nature. Does not seem pleuritic in nature.  NSR, no obvious ST elevations or depressions, slightly inverted T waves in inferior leads, per ED provider improved from previous, but do not see other to compare.  Could be MSK related secondary to coughing, but patient is not tender to palpation on exam. PMH: heart murmur   Clinical Impression   PTA, pt independent with ADL and mobility and worked full time at [employer]. Pt with 2/4 DOE with @ 150 ft with SpO2 dropping to 88. @ 1 min to rebound above 90. Began education on energy conservation and pursed lip breathing. HR 85; RR 18. Pt seated in chair with SpO2 95. Will follow acutely to facilitate safe DC home.     Follow Up Recommendations  No OT follow up;Supervision - Intermittent    Equipment Recommendations  None recommended by OT    Recommendations for Other Services       Precautions / Restrictions Precautions Precautions: Fall;Other (comment) Precaution Comments: monitor oxygen saturation Restrictions Weight Bearing Restrictions: No      Mobility Bed Mobility Overal bed mobility: Independent(supine>sit)                Transfers Overall transfer level: Modified independent Equipment used: None;Straight cane             General transfer comment: sit>stand from EOB independent, sit<>stand from recliner chair with SPC modI    Balance Overall balance assessment: Needs assistance Sitting-balance support: Feet supported Sitting balance-Leahy Scale:  Normal     Standing balance support: No upper extremity supported Standing balance-Leahy Scale: Good                             ADL either performed or assessed with clinical judgement   ADL Overall ADL's : Needs assistance/impaired                                     Functional mobility during ADLs: Modified independent General ADL Comments: Pt overall set up for ADL tasks; Began education regarding energy conservation strategies. Pt has a seat he can use when bathing.      Vision Baseline Vision/History: Wears glasses       Perception     Praxis      Pertinent Vitals/Pain Pain Assessment: No/denies pain     Hand Dominance Right   Extremity/Trunk Assessment Upper Extremity Assessment Upper Extremity Assessment: Overall WFL for tasks assessed   Lower Extremity Assessment Lower Extremity Assessment: Defer to PT evaluation   Cervical / Trunk Assessment Cervical / Trunk Assessment: Normal   Communication Communication Communication: No difficulties   Cognition Arousal/Alertness: Awake/alert Behavior During Therapy: WFL for tasks assessed/performed Overall Cognitive Status: Within Functional Limits for tasks assessed  General Comments  Educated pt on importance of sleeping on his stomach if able. Pt verbalized understanding    Exercises Exercises: Other exercises Other Exercises Other Exercises: Educated on pursed lip breathing   Shoulder Instructions      Home Living Family/patient expects to be discharged to:: Private residence Living Arrangements: Spouse/significant other Available Help at Discharge: Family(wife works from home, only has mild symptoms) Type of Home: House Home Access: Stairs to enter [employer] of Steps: 3 Entrance Stairs-Rails: None Home Layout: One level     Bathroom Shower/Tub: Walk-in shower;Tub/shower unit   Engineer, mining: Yes How Accessible: Accessible via walker Home Equipment: [place] - single point   Additional Comments: patient works full time for [employer], drives, shares IADLs with wife but wife does most of cooking as she works from home x 1 year      Prior Functioning/Environment Level of Independence: Independent        Comments: has SPC but doesn't use at baseline; works at [employer]        OT Problem List: Decreased activity tolerance;Decreased knowledge of use of DME or AE;Decreased safety awareness      OT Treatment/Interventions: Self-care/ADL training;Therapeutic exercise;Energy conservation;DME and/or AE instruction;Therapeutic activities;Patient/family education    OT Goals(Current goals can be found in the care plan section) Acute Rehab OT Goals Patient Stated Goal: to get better and go home OT Goal Formulation: With patient Time For Goal Achievement: 11/21/19 Potential to Achieve Goals: Good  OT Frequency: Min 3X/week   Barriers to D/C:            Co-evaluation              AM-PAC OT "6 Clicks" Daily Activity     Outcome Measure Help from another person eating meals?: None Help from another person taking care of personal grooming?: A Little Help from another person toileting, which includes using toliet, bedpan, or urinal?: A Little Help from another person bathing (including washing, rinsing, drying)?: A Little Help from another person to put on and taking off regular upper body clothing?: A Little Help from another person to put on and taking off regular lower body clothing?: A Little 6 Click Score: 19   End of Session Nurse Communication: Mobility status  Activity Tolerance: Patient tolerated treatment well Patient left: in chair;with call bell/phone within reach  OT Visit Diagnosis: Unsteadiness on feet (R26.81)                Time: [id number] OT Time Calculation (min): 21 min Charges:  OT General  Charges $OT Visit: 1 Visit OT Evaluation $OT Eval Low Complexity: [place], OT/L   Acute OT Clinical Specialist [place] Pager [phone] Office [phone]   [facility] 11/07/2019, 3:24 PM

## 2019-11-07 NOTE — Progress Notes (Signed)
Family Medicine Teaching Service Daily Progress Note Intern Pager: [phone]  Patient name: Robert Arroyo Medical record number: [id number] Date of birth: 01-10-1961 Age: 59 y.o. Gender: male  Primary Care Provider: Rochel Brome, MD Consultants:  Code Status: FULL   Pt Overview and Major Events to Date:  3/11: Admitted  Assessment and Plan:  Acute hypoxic respiratory failure 2/2 COVID-19 Pneumonia Feels better compared to yesterday on admission but still has SOB and dry cough. Has felt feverish overnight. Does not feel ready to go home.  Sats 92% on 2L when I went to see patient today. After increasing oxygen to 3L sats 95% on air. T 98.1, BP 106/65, HR 73 WBC wnl, CRP 23.5>23.1, D-dimer 1.15>1.07, On admission  LDH 281, ferritin 635,fibrinogen get greater than 800. F/u blood no growth < 24hrs  CXR bilateral parenchymal opacities consistent with COVID-19. CTA negative for PE  -Vitals per floor protocol -Continuous pulse ox -Cardiac monitoring -PT/OT eval -Remdesivir (3/11- ) -Decadron (3/11- ) -Tylenol as needed pain and fever -MiraLAX as needed constipation  - 100cc/hr NS x10 hrs given poor PO x 1 day -Ambulate with pulse ox when able  -Encourage O2 to maintain sats greater than 92%  Chest Pain Denies chest pain  Patient endorses intermittent. EKG: NSR, no obvious ST elevations or depressions, slightly inverted T waves in inferior leads. Trop 8, 8 . CTA neg -Cardiac monitoring -Continue to monitor   Heart Murmur No cardiac murmur on exam today BNP 72. CXR: no pulm edema.  Patient reports a history of heart murmur which she states is the reason he is on metoprolol. States that he was diagnosed with this many years ago. Metoprolol held on admission. -Restart home metoprolol   FEN/GI: regular Prophylaxis: Lovenox  Disposition: home in 1-2 days pending O2 status and PT/OT  Subjective:  Feels a little better compared to yesterday, still felt feverish overnight with a  cough. Coughing is triggered by talking. Does not feel ready to go home.   Objective: Temp:  [97.4 F (36.3 C)-102 F (38.9 C)] 98.1 F (36.7 C) (03/12 0600) Pulse Rate:  [62-88] 73 (03/12 0600) Resp:  [14-26] 18 (03/12 0600) BP: (98-128)/(65-78) 106/65 (03/12 0600) SpO2:  [92 %-97 %] 97 % (03/12 0600) Weight:  [77.1 kg-78.7 kg] 78.7 kg (03/12 0020)   Physical Exam: General: pleasant unwell appearing 59 yr old male, alert sitting up in bed  Cardiovascular: S1 and S2 present, no rubs or gallops  Respiratory: few bibasal crackles, poor AE throughout, normal WOB  Abdomen: Abdomen soft non tender, bowel sounds present  Extremities: no peripheral edema  Laboratory: Recent Labs  Lab 11/06/19 1637 11/07/19 0212  WBC 9.7 8.9  HGB 13.7 13.2  HCT 39.9 39.5  PLT 305 349   Recent Labs  Lab 11/06/19 1637 11/07/19 0212  NA 135 136  K 3.9 5.1  CL 102 100  CO2 22 26  BUN 19 22*  CREATININE 1.09 1.38*  CALCIUM 8.2* 8.1*  PROT 6.1* 5.9*  BILITOT 1.2 0.7  ALKPHOS 58 54  ALT 17 16  AST 28 24  GLUCOSE 130* 228*     Imaging/Diagnostic Tests: CT ANGIO CHEST PE W OR WO CONTRAST  Result Date: 11/06/2019 CLINICAL DATA:  Shortness of breath and history of COVID-19 positivity EXAM: CT ANGIOGRAPHY CHEST WITH CONTRAST TECHNIQUE: Multidetector CT imaging of the chest was performed using the standard protocol during bolus administration of intravenous contrast. Multiplanar CT image reconstructions and MIPs were obtained to evaluate the vascular anatomy.  CONTRAST:  48mL OMNIPAQUE IOHEXOL 350 MG/ML SOLN COMPARISON:  CT from the previous day. FINDINGS: Cardiovascular: Thoracic aorta shows no aneurysmal dilatation or dissection. No cardiac enlargement is noted. Pulmonary artery is well visualized within normal branching pattern. No intraluminal filling defect to suggest pulmonary embolism is seen. No coronary calcifications are noted. Mediastinum/Nodes: This thoracic inlet is within normal limits.  Scattered small mediastinal lymph nodes are noted likely reactive in nature. The esophagus is within normal limits. Lungs/Pleura: Diffuse bilateral ground-glass infiltrates are noted bilaterally with some is increase in the degree of consolidation in the bases bilaterally. To these changes are consistent with the given clinical history of COVID-19 positivity. No sizable effusion is seen. Upper Abdomen: Visualized upper abdomen is within normal limits. Musculoskeletal: Degenerative changes of the thoracic spine are noted. Review of the MIP images confirms the above findings. IMPRESSION: No evidence of pulmonary emboli. Changes consistent with COVID-19 positivity. Electronically Signed   By: Inez Catalina M.D.   On: 11/06/2019 22:41   DG Chest Port 1 View  Result Date: 11/06/2019 CLINICAL DATA:  Shortness of breath, positive COVID-19 EXAM: PORTABLE CHEST 1 VIEW COMPARISON:  11/05/2019 FINDINGS: Cardiac shadow is stable from the prior exam from the previous day. Lungs are well aerated bilaterally. Diffuse bilateral opacities are noted consistent with the patient's given clinical history of COVID-19 positivity. IMPRESSION: Bilateral parenchymal opacities consistent with the given clinical history of COVID-19 positivity. The degree of opacities have increased slightly in the interval from the prior plain film. Electronically Signed   By: Inez Catalina M.D.   On: 11/06/2019 17:06   Lattie Haw, MD 11/07/2019, 9:33 AM PGY-1, [place] Intern pager: [phone], text pages welcome

## 2019-11-07 NOTE — Progress Notes (Signed)
Robert Arroyo is a 59 y.o. male patient admitted from ED awake, alert - oriented  X 4 - no acute distress noted.  VSS - Blood pressure 109/72, pulse 72, temperature 97.6 F (36.4 C), temperature source Oral, resp. rate 19, height 5\' 7"  (1.702 m), weight 78.7 kg, SpO2 92 %.    IV in place, occlusive dsg intact without redness.  Orientation to room, and floor completed with information packet given to patient.  Patient declined safety video at this time.  Admission INP armband ID verified with patient/family, and in place.   SR up x 2, fall assessment complete, with patient able to verbalize understanding of risk associated with falls, and verbalized understanding to call a staff member before up out of bed.  Call light within reach, patient able to voice, and demonstrate understanding.  Skin, clean-dry- intact without evidence of bruising, or skin tears.   No evidence of skin break down noted on exam.     Will cont to eval and treat per MD orders.  [place], RN 11/07/2019 12:43 AM

## 2019-11-07 NOTE — Discharge Summary (Signed)
[facility] Discharge Summary  Patient name: Robert Arroyo Medical record number: [id number] Date of birth: April 18, 1961 Age: 59 y.o. Gender: male Date of Admission: 11/06/2019  Date of Discharge: 11/08/2019  Admitting Physician: Lind Covert, MD  Primary Care Provider: Rochel Brome, MD Consultants: none  Indication for Hospitalization: COVID-19 pneumonia  Discharge Diagnoses/Problem List:  Acute hypoxic respiratory failure secondary to COVID-19 pneumonia AKI Chest pain Reported history of heart murmur  Disposition: home  Discharge Condition: Medically stable for discharge  Discharge Exam:   Physical Exam:  General: 59 y.o. male in NAD Cardio: RRR no m/r/g Lungs: CTAB, no IWOB on RA Extremities: No edema  Brief Hospital Course:    COVID pneumonia Pt was admitted on 3/11 following subacute hx of fevers, coughing for the last few weeks.  In the last 24 hours prior to admission, patient had worsening of shortness of breath.  Tested positive for Covid on 3/8. Patient was found to be hypoxic to the 80s in the ED and placed on 4 L oxygen.  Was febrile to 102 and mildly tachypneic to her 20s.  Chest x-ray showed bilateral parenchymal opacities consistent with Covid pneumonia.  CTA was obtained to rule out acute PE which was negative.  Covid labs with LDH 281, ferritin 635, CRP 23.1, D-dimer 1.07, fibrinogen get greater than 800.  CRP and D-dimer were trended daily which showed overall improvement. Patient was started on remdesivir and dexamethasone on 3/11.  Patient was weaned off oxygen and on 3/13 was able to ambulate without desaturations.  At time time of discharge, his vitals were stable and he was without complaint.  AKI Creatinine 1 on admission, increased to 1.38 on 3/12.  Patient continued on IV fluids.  Nephrotoxic agents were avoided.  Creatinine on discharge is 0.94.  Patient was medically stable for discharge on 3/13.  At the time of  discharge patient's vital signs are stable.  Issues for Follow Up:  1. Patient was not continued on remdesivir as outpatient.   2. He will contine on decadron PO until 3/20 to complete a 10 day course. 3. Metoprolol held on discharge as indication is unclear and patient's heart rate and blood pressure were stable without it while inpatient.  Will defer to PCP to restart.  Significant Procedures: none  Significant Labs and Imaging:  Recent Labs  Lab 11/06/19 1637 11/07/19 0212 11/08/19 0202  WBC 9.7 8.9 10.7*  HGB 13.7 13.2 12.9*  HCT 39.9 39.5 37.9*  PLT 305 349 370   Recent Labs  Lab 11/06/19 1637 11/06/19 1637 11/07/19 0212 11/08/19 0202  NA 135  --  136 138  K 3.9   < > 5.1 4.5  CL 102  --  100 107  CO2 22  --  26 23  GLUCOSE 130*  --  228* 167*  BUN 19  --  22* 20  CREATININE 1.09  --  1.38* 0.94  CALCIUM 8.2*  --  8.1* 8.1*  ALKPHOS 58  --  54  --   AST 28  --  24  --   ALT 17  --  16  --   ALBUMIN 2.8*  --  2.5*  --    < > = values in this interval not displayed.    CT ANGIO CHEST PE W OR WO CONTRAST  Result Date: 11/06/2019 CLINICAL DATA:  Shortness of breath and history of COVID-19 positivity EXAM: CT ANGIOGRAPHY CHEST WITH CONTRAST TECHNIQUE: Multidetector CT imaging of the chest  was performed using the standard protocol during bolus administration of intravenous contrast. Multiplanar CT image reconstructions and MIPs were obtained to evaluate the vascular anatomy. CONTRAST:  38mL OMNIPAQUE IOHEXOL 350 MG/ML SOLN COMPARISON:  CT from the previous day. FINDINGS: Cardiovascular: Thoracic aorta shows no aneurysmal dilatation or dissection. No cardiac enlargement is noted. Pulmonary artery is well visualized within normal branching pattern. No intraluminal filling defect to suggest pulmonary embolism is seen. No coronary calcifications are noted. Mediastinum/Nodes: This thoracic inlet is within normal limits. Scattered small mediastinal lymph nodes are noted likely  reactive in nature. The esophagus is within normal limits. Lungs/Pleura: Diffuse bilateral ground-glass infiltrates are noted bilaterally with some is increase in the degree of consolidation in the bases bilaterally. To these changes are consistent with the given clinical history of COVID-19 positivity. No sizable effusion is seen. Upper Abdomen: Visualized upper abdomen is within normal limits. Musculoskeletal: Degenerative changes of the thoracic spine are noted. Review of the MIP images confirms the above findings. IMPRESSION: No evidence of pulmonary emboli. Changes consistent with COVID-19 positivity. Electronically Signed   By: Inez Catalina M.D.   On: 11/06/2019 22:41   DG Chest Port 1 View  Result Date: 11/06/2019 CLINICAL DATA:  Shortness of breath, positive COVID-19 EXAM: PORTABLE CHEST 1 VIEW COMPARISON:  11/05/2019 FINDINGS: Cardiac shadow is stable from the prior exam from the previous day. Lungs are well aerated bilaterally. Diffuse bilateral opacities are noted consistent with the patient's given clinical history of COVID-19 positivity. IMPRESSION: Bilateral parenchymal opacities consistent with the given clinical history of COVID-19 positivity. The degree of opacities have increased slightly in the interval from the prior plain film. Electronically Signed   By: Inez Catalina M.D.   On: 11/06/2019 17:06   Results/Tests Pending at Time of Discharge: none  Discharge Medications:  Allergies as of 11/08/2019   No Known Allergies     Medication List    STOP taking these medications   ibuprofen 200 MG tablet Commonly known as: ADVIL   metoprolol succinate 25 MG 24 hr tablet Commonly known as: TOPROL-XL     TAKE these medications   acetaminophen 500 MG tablet Commonly known as: TYLENOL Take 500-1,000 mg by mouth every 6 (six) hours as needed for fever (malaise, or pain).   aspirin EC 81 MG tablet Take 81 mg by mouth daily.   azelastine 0.1 % nasal spray Commonly known as:  ASTELIN Place 2 sprays into both nostrils 2 (two) times daily.   benzonatate 200 MG capsule Commonly known as: TESSALON Take 200 mg by mouth 3 (three) times daily as needed for cough.   dexamethasone 6 MG tablet Commonly known as: DECADRON Take 1 tablet (6 mg total) by mouth daily for 7 days.       Discharge Instructions: Please refer to Patient Instructions section of EMR for full details.  Patient was counseled important signs and symptoms that should prompt return to medical care, changes in medications, dietary instructions, activity restrictions, and follow up appointments.   Follow-Up Appointments: Follow-up Information    Cox, Kirsten, MD. Schedule an appointment as soon as possible for a visit in 1 week(s).   Specialties: Family Medicine, Interventional Cardiology, Radiology, Anesthesiology Contact information: [address] [place] [address] [phone]           Cleophas Dunker, DO 11/08/2019, 2:03 PM PGY-2, [place]

## 2019-11-08 DIAGNOSIS — N179 Acute kidney failure, unspecified: Secondary | ICD-10-CM

## 2019-11-08 LAB — CBC
HCT: 37.9 % — ABNORMAL LOW (ref 39.0–52.0)
Hemoglobin: 12.9 g/dL — ABNORMAL LOW (ref 13.0–17.0)
MCH: 29.9 pg (ref 26.0–34.0)
MCHC: 34 g/dL (ref 30.0–36.0)
MCV: 87.9 fL (ref 80.0–100.0)
Platelets: 370 10*3/uL (ref 150–400)
RBC: 4.31 MIL/uL (ref 4.22–5.81)
RDW: 12.7 % (ref 11.5–15.5)
WBC: 10.7 10*3/uL — ABNORMAL HIGH (ref 4.0–10.5)
nRBC: 0 % (ref 0.0–0.2)

## 2019-11-08 LAB — C-REACTIVE PROTEIN: CRP: 14 mg/dL — ABNORMAL HIGH (ref <1.0)

## 2019-11-08 LAB — BASIC METABOLIC PANEL
Anion gap: 8 (ref 5–15)
BUN: 20 mg/dL (ref 6–20)
CO2: 23 mmol/L (ref 22–32)
Calcium: 8.1 mg/dL — ABNORMAL LOW (ref 8.9–10.3)
Chloride: 107 mmol/L (ref 98–111)
Creatinine, Ser: 0.94 mg/dL (ref 0.61–1.24)
GFR calc Af Amer: >60 mL/min (ref >60)
GFR calc non Af Amer: >60 mL/min (ref >60)
Glucose, Bld: 167 mg/dL — ABNORMAL HIGH (ref 70–99)
Potassium: 4.5 mmol/L (ref 3.5–5.1)
Sodium: 138 mmol/L (ref 135–145)

## 2019-11-08 LAB — D-DIMER, QUANTITATIVE: D-Dimer, Quant: 0.86 ug/mL-FEU — ABNORMAL HIGH (ref 0.00–0.50)

## 2019-11-08 MED ORDER — DEXAMETHASONE 6 MG PO TABS: 6.0000 mg | ORAL_TABLET | ORAL | 0 refills | Status: AC

## 2019-11-08 NOTE — TOC Transition Note (Signed)
Transition of Care [facility]) - CM/SW Discharge Note   Patient Details  Name: Robert Arroyo MRN: [id number] Date of Birth: 1960/10/12  Transition of Care [facility]) CM/SW Contact:  Carles Collet, RN Phone Number: 11/08/2019, 2:31 PM   Clinical Narrative:    From home w wife. Declines HH at this time. Wife to provide transport home.           Patient Goals and CMS Choice        Discharge Placement                       Discharge Plan and Services                                     Social Determinants of Health (SDOH) Interventions     Readmission Risk Interventions No flowsheet data found.

## 2019-11-08 NOTE — Progress Notes (Signed)
SATURATION QUALIFICATIONS: (This note is used to comply with regulatory documentation for home oxygen)  Patient Saturations on Room Air at Rest = 95%  Patient Saturations on Room Air while Ambulating = 92%   Please briefly explain why patient needs home oxygen: pt does not qualify for home oxygen, he did not drop bellow 92% on room Air.

## 2019-11-08 NOTE — Discharge Instructions (Signed)
We discussed that we would not continue your metoprolol on discharge because you have not needed it here.  I will leave the decision to your PCP to restart it.  Continue to take your steroids through the 20th to complete 10 days.  You do not need to continue remdesivir.  If you experience worsening shortness of breath or chest pain, you should go to the emergency room right away.  We are glad that you are feeling better!  Continue to stay well hydrated.

## 2019-11-08 NOTE — Progress Notes (Signed)
Jaimen Conlee to be D/C'd Home per MD order.  Discussed with the patient and all questions fully answered.  VSS, Skin clean, dry and intact without evidence of skin break down, no evidence of skin tears noted. IV catheter discontinued intact. Site without signs and symptoms of complications. Dressing and pressure applied.  An After Visit Summary was printed and given to the patient. Patient received prescription.  D/c education completed with patient/family including follow up instructions, medication list, d/c activities limitations if indicated, with other d/c instructions as indicated by MD - patient able to verbalize understanding, all questions fully answered.   Patient instructed to return to ED, call 911, or call MD for any changes in condition.   Patient escorted via [place], and D/C home via private auto.  Luci Bank 11/08/2019 6:12 PM

## 2019-11-08 NOTE — Progress Notes (Signed)
Family Medicine Teaching Service Daily Progress Note Intern Pager: [phone]  Patient name: Robert Arroyo Medical record number: [id number] Date of birth: 1961-03-16 Age: 59 y.o. Gender: male  Primary Care Provider: Rochel Brome, MD Consultants:  Code Status: FULL   Pt Overview and Major Events to Date:  3/11: Admitted  Assessment and Plan:  Acute hypoxic respiratory failure 2/2 COVID-19 Pneumonia Patient on 2 L per [place] overnight.  This a.m. )2 sats in high 90s on RA and reports that he feels well enough to go home.  Has remained afebrile since admission with stable vitals.  Ambulated with pulse ox today, oxygen saturation 92%, 95% room air.  PT recommending home health if patient discharges in next day or 2.  CRP improving from 23.5-14.  D-dimer also improving from 1.15-0.86.  WBC mildly elevated from 8.9-10.7, patient is receiving steroids. -Continuous pulse ox -Cardiac monitoring -PT/OT eval -Remdesivir (3/11- ), will stop on d/c -Decadron (3/11- ), cont for 10 days total - d/c home today  [id number] Creatinine 1.09>1.38>0.94.  Improved with fluids throughout the day. - d/c fluids -Encourage p.o. intake  Chest Pain: Resolved Tropes and EKG negative on admission.  Patient not currently endorsing any chest pain. -Cardiac monitoring -Continue to monitor   Reported history of heart Murmur He remains without a cardiac murmur on exam and does not have any signs of fluid overload.  BNP was within normal limits on admission and chest x-ray was without sign of volume overload.  Heart rate and blood pressure have been within normal limits during this hospitalization while holding metoprolol.  Question history need for this.  Will likely not restart on discharge and will leave up to PCP to restart. -Continue to hold metoprolol, will also hold on discharge  FEN/GI: regular Prophylaxis: Lovenox  Disposition: Plan for home with home health today  Subjective:  Patient doing well  this morning.  Denies any complaints.  States that he would like to go home today.  Objective: Temp:  [97.9 F (36.6 C)-98.3 F (36.8 C)] 98 F (36.7 C) (03/13 0500) Pulse Rate:  [75-87] 79 (03/13 0800) Resp:  [16-28] 19 (03/13 0800) BP: (116-138)/(66-86) 124/73 (03/13 0800) SpO2:  [89 %-95 %] 90 % (03/13 0800)   Physical Exam:  General: 59 y.o. male in NAD Cardio: RRR no m/r/g Lungs: CTAB, no IWOB on RA Extremities: No edema   Laboratory: Recent Labs  Lab 11/06/19 1637 11/07/19 0212 11/08/19 0202  WBC 9.7 8.9 10.7*  HGB 13.7 13.2 12.9*  HCT 39.9 39.5 37.9*  PLT 305 349 370   Recent Labs  Lab 11/06/19 1637 11/07/19 0212 11/08/19 0202  NA 135 136 138  K 3.9 5.1 4.5  CL 102 100 107  CO2 22 26 23   BUN 19 22* 20  CREATININE 1.09 1.38* 0.94  CALCIUM 8.2* 8.1* 8.1*  PROT 6.1* 5.9*  --   BILITOT 1.2 0.7  --   ALKPHOS 58 54  --   ALT 17 16  --   AST 28 24  --   GLUCOSE 130* 228* 167*     Imaging/Diagnostic Tests: No results found. [place], DO 11/08/2019, 10:18 AM PGY-2, [place] Intern pager: [phone], text pages welcome

## 2019-11-11 ENCOUNTER — Telehealth: Payer: Self-pay

## 2019-11-11 LAB — CULTURE, BLOOD (ROUTINE X 2)
Culture: NO GROWTH
Culture: NO GROWTH
Special Requests: ADEQUATE
Special Requests: ADEQUATE

## 2019-11-11 NOTE — Telephone Encounter (Signed)
Transition Care Management Follow-up Telephone Call  [facility] Robert Arroyo 17-Jul-1961  Admit Date: 11/07/19 Discharge Date: 11/08/19 Diagnoses: PNA due to COVID   2 day post discharge: 11/10/19 7 day post discharge: 11/15/19 14 day post discharge: 11/22/19  Robert Arroyo was discharged from [facility] on 11/08/19 with the diagnoses listed above.  He was contacted today via telephone in regards to transition of care.  He was not available so I left a message on the answering machine for a return call to schedule his hospital follow-up appointment requested in one week post discharge.  Hospital [facility]: Patient previously tested positive for COVID on 11/03/19.  He presented to ED with [facility] found to by hypoxic with SATS in 80's.  Fever of 102.  Chest x-ray showed bilateral parenchymal opacities consistent with Covid pneumonia.  CTA was obtained to rule out acute PE which was negative.  Patient was started on remdesivir and dexamethasone on 3/11.  Patient was weaned off oxygen and on 3/13  Creatinine 1 on admission, increased to 1.38 on 3/12.  Patient continued on IV fluids.  Creatinine on discharge is 0.94.  Discharge Instructions: (discharged to home)  !!! NEEDS FOLLOW-UP WITH PCP IN ONE WEEK !!! 1. He will contine on decadron PO until 3/20 to complete a 10 day course. 2. Metoprolol held on discharge as indication is unclear and patient's heart rate and blood pressure were stable without it while inpatient.  Will defer to PCP to restart.  Items Reviewed:  Medications reviewed: yes  Allergies reviewed: yes  Referrals reviewed: yes

## 2020-01-25 ENCOUNTER — Emergency Department (HOSPITAL_COMMUNITY): Payer: Commercial Managed Care - PPO

## 2020-01-25 ENCOUNTER — Other Ambulatory Visit: Payer: Self-pay

## 2020-01-25 ENCOUNTER — Inpatient Hospital Stay (HOSPITAL_COMMUNITY)
Admission: EM | Admit: 2020-01-25 | Discharge: 2020-01-27 | DRG: 536 | Disposition: A | Discharge status: Home Health | Payer: Commercial Managed Care - PPO | Attending: Internal Medicine | Admitting: Internal Medicine

## 2020-01-25 DIAGNOSIS — S329XXA Fracture of unspecified parts of lumbosacral spine and pelvis, initial encounter for closed fracture: Secondary | ICD-10-CM | POA: Diagnosis present

## 2020-01-25 DIAGNOSIS — R011 Cardiac murmur, unspecified: Secondary | ICD-10-CM | POA: Diagnosis present

## 2020-01-25 DIAGNOSIS — S32049A Unspecified fracture of fourth lumbar vertebra, initial encounter for closed fracture: Secondary | ICD-10-CM | POA: Diagnosis present

## 2020-01-25 DIAGNOSIS — E785 Hyperlipidemia, unspecified: Secondary | ICD-10-CM | POA: Diagnosis present

## 2020-01-25 DIAGNOSIS — W19XXXA Unspecified fall, initial encounter: Secondary | ICD-10-CM

## 2020-01-25 DIAGNOSIS — S32591A Other specified fracture of right pubis, initial encounter for closed fracture: Secondary | ICD-10-CM | POA: Diagnosis not present

## 2020-01-25 DIAGNOSIS — Z8616 Personal history of COVID-19: Secondary | ICD-10-CM

## 2020-01-25 DIAGNOSIS — N179 Acute kidney failure, unspecified: Secondary | ICD-10-CM | POA: Diagnosis present

## 2020-01-25 DIAGNOSIS — S32810A Multiple fractures of pelvis with stable disruption of pelvic ring, initial encounter for closed fracture: Secondary | ICD-10-CM | POA: Diagnosis not present

## 2020-01-25 DIAGNOSIS — Z23 Encounter for immunization: Secondary | ICD-10-CM

## 2020-01-25 DIAGNOSIS — Z7289 Other problems related to lifestyle: Secondary | ICD-10-CM

## 2020-01-25 DIAGNOSIS — Z20822 Contact with and (suspected) exposure to covid-19: Secondary | ICD-10-CM | POA: Diagnosis present

## 2020-01-25 DIAGNOSIS — S32119A Unspecified Zone I fracture of sacrum, initial encounter for closed fracture: Secondary | ICD-10-CM | POA: Diagnosis present

## 2020-01-25 DIAGNOSIS — J302 Other seasonal allergic rhinitis: Secondary | ICD-10-CM | POA: Diagnosis present

## 2020-01-25 DIAGNOSIS — S32059A Unspecified fracture of fifth lumbar vertebra, initial encounter for closed fracture: Secondary | ICD-10-CM | POA: Diagnosis present

## 2020-01-25 DIAGNOSIS — Z79899 Other long term (current) drug therapy: Secondary | ICD-10-CM

## 2020-01-25 DIAGNOSIS — K4090 Unilateral inguinal hernia, without obstruction or gangrene, not specified as recurrent: Secondary | ICD-10-CM | POA: Diagnosis present

## 2020-01-25 DIAGNOSIS — Z7982 Long term (current) use of aspirin: Secondary | ICD-10-CM

## 2020-01-25 DIAGNOSIS — W14XXXA Fall from tree, initial encounter: Secondary | ICD-10-CM

## 2020-01-25 LAB — CBC
HCT: 45.5 % (ref 39.0–52.0)
Hemoglobin: 15.1 g/dL (ref 13.0–17.0)
MCH: 29.8 pg (ref 26.0–34.0)
MCHC: 33.2 g/dL (ref 30.0–36.0)
MCV: 89.7 fL (ref 80.0–100.0)
Platelets: 228 10*3/uL (ref 150–400)
RBC: 5.07 MIL/uL (ref 4.22–5.81)
RDW: 13.2 % (ref 11.5–15.5)
WBC: 18.3 10*3/uL — ABNORMAL HIGH (ref 4.0–10.5)
nRBC: 0 % (ref 0.0–0.2)

## 2020-01-25 LAB — ETHANOL: Alcohol, Ethyl (B): <10 mg/dL (ref <10)

## 2020-01-25 LAB — COMPREHENSIVE METABOLIC PANEL
ALT: 35 U/L (ref 0–44)
AST: 45 U/L — ABNORMAL HIGH (ref 15–41)
Albumin: 3.9 g/dL (ref 3.5–5.0)
Alkaline Phosphatase: 73 U/L (ref 38–126)
Anion gap: 10 (ref 5–15)
BUN: 15 mg/dL (ref 6–20)
CO2: 24 mmol/L (ref 22–32)
Calcium: 9 mg/dL (ref 8.9–10.3)
Chloride: 104 mmol/L (ref 98–111)
Creatinine, Ser: 1.33 mg/dL — ABNORMAL HIGH (ref 0.61–1.24)
GFR calc Af Amer: >60 mL/min (ref >60)
GFR calc non Af Amer: 58 mL/min — ABNORMAL LOW (ref >60)
Glucose, Bld: 159 mg/dL — ABNORMAL HIGH (ref 70–99)
Potassium: 4.4 mmol/L (ref 3.5–5.1)
Sodium: 138 mmol/L (ref 135–145)
Total Bilirubin: 1.1 mg/dL (ref 0.3–1.2)
Total Protein: 6.5 g/dL (ref 6.5–8.1)

## 2020-01-25 LAB — I-STAT CHEM 8, ED
BUN: 18 mg/dL (ref 6–20)
Calcium, Ion: 1.13 mmol/L — ABNORMAL LOW (ref 1.15–1.40)
Chloride: 106 mmol/L (ref 98–111)
Creatinine, Ser: 1.4 mg/dL — ABNORMAL HIGH (ref 0.61–1.24)
Glucose, Bld: 156 mg/dL — ABNORMAL HIGH (ref 70–99)
HCT: 43 % (ref 39.0–52.0)
Hemoglobin: 14.6 g/dL (ref 13.0–17.0)
Potassium: 3.8 mmol/L (ref 3.5–5.1)
Sodium: 140 mmol/L (ref 135–145)
TCO2: 24 mmol/L (ref 22–32)

## 2020-01-25 LAB — LACTIC ACID, PLASMA: Lactic Acid, Venous: 2.1 mmol/L (ref 0.5–1.9)

## 2020-01-25 LAB — PROTIME-INR
INR: 1.2 (ref 0.8–1.2)
Prothrombin Time: 14.5 seconds (ref 11.4–15.2)

## 2020-01-25 LAB — SAMPLE TO BLOOD BANK

## 2020-01-25 MED ORDER — ONDANSETRON HCL 4 MG PO TABS: 4.0000 mg | ORAL_TABLET | Freq: Four times a day (QID) | ORAL | Status: DC | PRN

## 2020-01-25 MED ORDER — OXYCODONE HCL 5 MG PO TABS
10.0000 mg | ORAL_TABLET | ORAL | Status: DC | PRN
  Administered 2020-01-26 – 2020-01-27 (×2): 10 mg via ORAL
  Filled 2020-01-25 (×2): qty 2

## 2020-01-25 MED ORDER — ACETAMINOPHEN 500 MG PO TABS
1000.0000 mg | ORAL_TABLET | Freq: Four times a day (QID) | ORAL | Status: DC | PRN
  Administered 2020-01-27 (×2): 1000 mg via ORAL
  Filled 2020-01-25 (×2): qty 2

## 2020-01-25 MED ORDER — SENNOSIDES-DOCUSATE SODIUM 8.6-50 MG PO TABS: 1.0000 | ORAL_TABLET | Freq: Every evening | ORAL | Status: DC | PRN

## 2020-01-25 MED ORDER — FENTANYL CITRATE (PF) 100 MCG/2ML IJ SOLN
100.0000 ug | Freq: Once | INTRAMUSCULAR | Status: AC
  Administered 2020-01-25: 100 ug via INTRAVENOUS
  Filled 2020-01-25: qty 2

## 2020-01-25 MED ORDER — ONDANSETRON 4 MG PO TBDP: 4.0000 mg | ORAL_TABLET | Freq: Once | ORAL | Status: DC

## 2020-01-25 MED ORDER — TETANUS-DIPHTH-ACELL PERTUSSIS 5-2.5-18.5 LF-MCG/0.5 IM SUSP
0.5000 mL | Freq: Once | INTRAMUSCULAR | Status: AC
  Administered 2020-01-25: 0.5 mL via INTRAMUSCULAR
  Filled 2020-01-25: qty 0.5

## 2020-01-25 MED ORDER — ONDANSETRON HCL 4 MG/2ML IJ SOLN
4.0000 mg | Freq: Once | INTRAMUSCULAR | Status: AC
  Administered 2020-01-25: 4 mg via INTRAVENOUS
  Filled 2020-01-25: qty 2

## 2020-01-25 MED ORDER — HYDROMORPHONE HCL 1 MG/ML IJ SOLN
0.5000 mg | Freq: Once | INTRAMUSCULAR | Status: DC
  Filled 2020-01-25: qty 1

## 2020-01-25 MED ORDER — ONDANSETRON HCL 4 MG/2ML IJ SOLN: 4.0000 mg | Freq: Four times a day (QID) | INTRAMUSCULAR | Status: DC | PRN

## 2020-01-25 MED ORDER — ACETAMINOPHEN 650 MG RE SUPP: 650.0000 mg | Freq: Four times a day (QID) | RECTAL | Status: DC | PRN

## 2020-01-25 MED ORDER — SODIUM CHLORIDE 0.9 % IV BOLUS
1000.0000 mL | Freq: Once | INTRAVENOUS | Status: AC
  Administered 2020-01-25: 1000 mL via INTRAVENOUS

## 2020-01-25 MED ORDER — LACTATED RINGERS IV SOLN: INTRAVENOUS | Status: AC

## 2020-01-25 MED ORDER — HYDROMORPHONE HCL 1 MG/ML IJ SOLN
1.0000 mg | INTRAMUSCULAR | Status: DC | PRN
  Administered 2020-01-26 (×2): 1 mg via INTRAVENOUS
  Filled 2020-01-25 (×3): qty 1

## 2020-01-25 MED ORDER — MORPHINE SULFATE (PF) 4 MG/ML IV SOLN
4.0000 mg | Freq: Once | INTRAVENOUS | Status: AC
  Administered 2020-01-25: 4 mg via INTRAVENOUS
  Filled 2020-01-25: qty 1

## 2020-01-25 MED ORDER — IOHEXOL 350 MG/ML SOLN
100.0000 mL | Freq: Once | INTRAVENOUS | Status: AC | PRN
  Administered 2020-01-25: 100 mL via INTRAVENOUS

## 2020-01-25 NOTE — Consult Note (Signed)
Responded to page, pt unavailable, no family present, staff will page again if further chaplain services desired.  Rev. Adler [place] Chaplain 

## 2020-01-25 NOTE — ED Notes (Signed)
2L [place] placed on pt, O2 drops to 87% while sleeping.  Will continue to monitor.

## 2020-01-25 NOTE — H&P (Signed)
History and Physical    Robert Arroyo [id number] DOB: 17-Feb-1961 DOA: 01/25/2020  PCP: Rochel Brome, MD  Patient coming from: Home  I have personally briefly reviewed patient's old medical records in [place]  Chief Complaint: Right hip pain after a fall from height  HPI: Robert Arroyo is a 59 y.o. male with medical history significant recent COVID-19 pneumonia who presents to the ED for evaluation of right hip pain after a fall.  Patient states he was taking down a deer ladder stand earlier today (01/25/2020) when the stand fell out of a tree while patient was still on it.  Height was approximately just under 20 feet.  The deer stand fell to the side and hit another tree on the way down.  Patient states he did also hit his head on the way down.  He landed directly on his right buttocks and had immediate severe pain.  He was unable to bear weight on his own.  He had diminished movement in his right hip but was able to move his right leg below the knee and all other extremities well.  He denied any loss of consciousness.  Patient states the only medications he takes are metoprolol which was started years ago due to history of a heart murmur and cetirizine for seasonal allergies.  He denies any known history of hypertension or irregular heart rhythm.  He was previously admitted from 11/06/2019-11/08/2019 for COVID-19 pneumonia in which time he was treated with remdesivir and Decadron.  He has been doing well since that discharge.  ED Course:  Initial vitals showed BP 130/66, pulse 92, RR 23, temp 97.4 Fahrenheit, SPO2 96% on room air.  Labs are notable for creatinine 1.33 (0.94 on 11/08/2019), sodium 138, potassium 4.4, bicarb 24, WBC 18.3, hemoglobin 15.1, platelets 228,000, lactic acid 2.1, serum ethanol undetectable.  SARS-CoV-2 PCR is ordered and pending.  Portable chest x-ray is negative for acute cardiopulmonary finding.  Portable pelvic x-ray suggestive of right inferior  pubic ramus fracture.  CT head without contrast is negative for intracranial trauma.  CT cervical spine without contrast is negative for cervical spine fracture.  CT abdomen/pelvis shows comminuted mildly displaced fracture of the right superior pubic ramus and right inferior pubic ramus with extension into the symphysis pubis.  Mildly displaced fracture of posterior right pelvic bone superior to the ischial tuberosity and nondisplaced fracture of the superior medial right acetabular wall are seen.  Nondisplaced longitudinal fracture of the right sacral ala and right L4 and L5 transverse processes fractures also seen.  CTA neck negative for carotid or vertebral injury in the neck.  Orthopedics were consulted and recommended weightbearing as tolerated and admission for pain control if unable to tolerate.  Patient was given 1 L normal saline, morphine, fentanyl, Dilaudid without significant improvement.  The hospitalist service was consulted to admit for further evaluation and management.  Review of Systems: All systems reviewed and are negative except as documented in history of present illness above.   Past Medical History:  Diagnosis Date  . A-fib ([place]) 2003  . Hyperlipidemia   . Right inguinal hernia     Past Surgical History:  Procedure Laterality Date  . CARDIAC CATHETERIZATION  2003    Social History:  reports that he has never smoked. He has never used smokeless tobacco. He reports current alcohol use. He reports that he does not use drugs.  No Known Allergies  Family History  Problem Relation Age of Onset  . Leukemia Mother   .  Glaucoma Father      Prior to Admission medications   Medication Sig Start Date End Date Taking? Authorizing Provider  acetaminophen (TYLENOL) 500 MG tablet Take 500-1,000 mg by mouth every 6 (six) hours as needed for fever (malaise, or pain).    [provider]  aspirin EC 81 MG tablet Take 81 mg by mouth daily.    [provider]  azelastine (ASTELIN) 0.1 % nasal spray Place 2 sprays into both nostrils 2 (two) times daily. 11/03/19   [provider]  benzonatate (TESSALON) 200 MG capsule Take 200 mg by mouth 3 (three) times daily as needed for cough.  11/03/19   [provider]    Physical Exam: Vitals:   01/25/20 2130 01/25/20 2145 01/25/20 2315 01/25/20 2330  BP: 107/72 122/71 114/72 109/69  Pulse: 82 94 86 78  Resp: 19 (!) 29 18 18   Temp:      TempSrc:      SpO2: 91% 92% 100% 97%  Weight:      Height:       Constitutional: Resting supine in bed, NAD, calm, somewhat uncomfortable Eyes: PERRL, lids and conjunctivae normal ENMT: Mucous membranes are moist. Posterior pharynx clear of any exudate or lesions.Normal dentition.  Neck: normal, supple, no masses. Respiratory: clear to auscultation anteriorly. Normal respiratory effort. No accessory muscle use.  Cardiovascular: Regular rate and rhythm, no murmurs / rubs / gallops. No extremity edema. 2+ pedal pulses. Abdomen: Mild suprapubic tenderness, no masses palpated. No hepatosplenomegaly. Bowel sounds positive.  Musculoskeletal: no clubbing / cyanosis.  ROM diminished at the right hip due to pain from pelvic fractures.  He is able to bend right knee, ROM of right foot and all other extremities intact. Skin: Small laceration right neck below the angle of the mandible Neurologic: CN 2-12 grossly intact. Sensation intact, Strength diminished at right hip due to pelvic fractures otherwise intact. Psychiatric: Normal judgment and insight. Alert and oriented x 3. Normal mood.   Labs on Admission: I have personally reviewed following labs and imaging studies  CBC: Recent Labs  Lab 01/25/20 1803 01/25/20 1811  WBC 18.3*  --   HGB 15.1 14.6  HCT 45.5 43.0  MCV 89.7  --   PLT 228  --    Basic Metabolic Panel: Recent Labs  Lab 01/25/20 1803 01/25/20 1811  NA 138 140  K 4.4 3.8  CL 104 106  CO2 24  --   GLUCOSE 159* 156*  BUN 15 18    CREATININE 1.33* 1.40*  CALCIUM 9.0  --    GFR: Estimated Creatinine Clearance: 52.5 mL/min (A) (by C-G formula based on SCr of 1.4 mg/dL (H)). Liver Function Tests: Recent Labs  Lab 01/25/20 1803  AST 45*  ALT 35  ALKPHOS 73  BILITOT 1.1  PROT 6.5  ALBUMIN 3.9   No results for input(s): LIPASE, AMYLASE in the last 168 hours. No results for input(s): AMMONIA in the last 168 hours. Coagulation Profile: Recent Labs  Lab 01/25/20 1803  INR 1.2   Cardiac Enzymes: No results for input(s): CKTOTAL, CKMB, CKMBINDEX, TROPONINI in the last 168 hours. BNP (last 3 results) No results for input(s): PROBNP in the last 8760 hours. HbA1C: No results for input(s): HGBA1C in the last 72 hours. CBG: No results for input(s): GLUCAP in the last 168 hours. Lipid Profile: No results for input(s): CHOL, HDL, LDLCALC, TRIG, CHOLHDL, LDLDIRECT in the last 72 hours. Thyroid Function Tests: No results for input(s): TSH, T4TOTAL, FREET4,  T3FREE, THYROIDAB in the last 72 hours. Anemia Panel: No results for input(s): VITAMINB12, FOLATE, FERRITIN, TIBC, IRON, RETICCTPCT in the last 72 hours. Urine analysis: No results found for: COLORURINE, APPEARANCEUR, [place], [place], GLUCOSEU, HGBUR, BILIRUBINUR, KETONESUR, PROTEINUR, UROBILINOGEN, NITRITE, LEUKOCYTESUR  Radiological Exams on Admission: CT HEAD WO CONTRAST  Result Date: 01/25/2020 CLINICAL DATA:  Fall from deer stand. EXAM: CT HEAD WITHOUT CONTRAST CT CERVICAL SPINE WITHOUT CONTRAST TECHNIQUE: Multidetector CT imaging of the head and cervical spine was performed following the standard protocol without intravenous contrast. Multiplanar CT image reconstructions of the cervical spine were also generated. COMPARISON:  None. FINDINGS: CT HEAD FINDINGS Brain: No acute intracranial hemorrhage. No focal mass lesion. No CT evidence of acute infarction. No midline shift or mass effect. No hydrocephalus. Basilar cisterns are patent. Vascular: No hyperdense  vessel or unexpected calcification. Skull: Normal. Negative for fracture or focal lesion. Sinuses/Orbits: Paranasal sinuses and mastoid air cells are clear. Orbits are clear. Other: None. CT CERVICAL SPINE FINDINGS Alignment: Normal alignment of the cervical vertebral bodies. Skull base and vertebrae: Normal craniocervical junction. No loss of vertebral body height or disc height. Normal facet articulation. No evidence of fracture. Soft tissues and spinal canal: No prevertebral soft tissue swelling. No perispinal or epidural hematoma. Disc levels: There is endplate spurring from [id number]. No subluxation. Upper chest: Clear Other: None IMPRESSION: 1. No intracranial trauma. 2. No cervical spine fracture. Electronically Signed   By: Suzy Bouchard M.D.   On: 01/25/2020 18:53   CT Angio Neck W and/or Wo Contrast  Result Date: 01/25/2020 CLINICAL DATA:  [place] from deer stand today.  Neck trauma blunt. EXAM: CT ANGIOGRAPHY NECK TECHNIQUE: Multidetector CT imaging of the neck was performed using the standard protocol during bolus administration of intravenous contrast. Multiplanar CT image reconstructions and MIPs were obtained to evaluate the vascular anatomy. Carotid stenosis measurements (when applicable) are obtained utilizing NASCET criteria, using the distal internal carotid diameter as the denominator. CONTRAST:  174mL OMNIPAQUE IOHEXOL 350 MG/ML SOLN COMPARISON:  None. FINDINGS: Aortic arch: Standard branching. Imaged portion shows no evidence of aneurysm or dissection. No significant stenosis of the major arch vessel origins. Right carotid system: Normal right carotid. No atherosclerotic disease dissection or irregularity. Left carotid system: Normal left carotid. No atherosclerotic disease, dissection, or irregularity Vertebral arteries: Normal vertebral arteries bilaterally. No injury or stenosis. Skeleton: Cervical spondylosis most prominent at C4-5 and C5-6. Other neck: Negative for soft tissue mass or  hematoma in the neck. Upper chest: Mild dependent atelectasis bilaterally. No effusion or pneumothorax. IMPRESSION: 1. Negative for carotid or vertebral artery injury in the neck. No significant stenosis. 2. Negative for soft tissue mass or hematoma in the neck. Electronically Signed   By: Franchot Gallo M.D.   On: 01/25/2020 19:10   CT CERVICAL SPINE WO CONTRAST  Result Date: 01/25/2020 CLINICAL DATA:  Fall from deer stand. EXAM: CT HEAD WITHOUT CONTRAST CT CERVICAL SPINE WITHOUT CONTRAST TECHNIQUE: Multidetector CT imaging of the head and cervical spine was performed following the standard protocol without intravenous contrast. Multiplanar CT image reconstructions of the cervical spine were also generated. COMPARISON:  None. FINDINGS: CT HEAD FINDINGS Brain: No acute intracranial hemorrhage. No focal mass lesion. No CT evidence of acute infarction. No midline shift or mass effect. No hydrocephalus. Basilar cisterns are patent. Vascular: No hyperdense vessel or unexpected calcification. Skull: Normal. Negative for fracture or focal lesion. Sinuses/Orbits: Paranasal sinuses and mastoid air cells are clear. Orbits are clear. Other: None. CT CERVICAL SPINE FINDINGS  Alignment: Normal alignment of the cervical vertebral bodies. Skull base and vertebrae: Normal craniocervical junction. No loss of vertebral body height or disc height. Normal facet articulation. No evidence of fracture. Soft tissues and spinal canal: No prevertebral soft tissue swelling. No perispinal or epidural hematoma. Disc levels: There is endplate spurring from X33443. No subluxation. Upper chest: Clear Other: None IMPRESSION: 1. No intracranial trauma. 2. No cervical spine fracture. Electronically Signed   By: Suzy Bouchard M.D.   On: 01/25/2020 18:53   CT ABDOMEN PELVIS W CONTRAST  Result Date: 01/25/2020 CLINICAL DATA:  59 year old male with abdominal trauma. EXAM: CT ABDOMEN AND PELVIS WITH CONTRAST TECHNIQUE: Multidetector CT imaging  of the abdomen and pelvis was performed using the standard protocol following bolus administration of intravenous contrast. CONTRAST:  170mL OMNIPAQUE IOHEXOL 350 MG/ML SOLN COMPARISON:  None. FINDINGS: Lower chest: Bibasilar atelectasis. No intra-abdominal free air. Small amount of blood within the pelvis anteriorly. Hepatobiliary: No focal liver abnormality is seen. No gallstones, gallbladder wall thickening, or biliary dilatation. Pancreas: Unremarkable. No pancreatic ductal dilatation or surrounding inflammatory changes. Spleen: Normal in size without focal abnormality. Adrenals/Urinary Tract: The adrenal glands are unremarkable. The kidneys, visualized ureters, and urinary bladder appear unremarkable. Stomach/Bowel: There is no bowel obstruction or active inflammation. The appendix is normal. Vascular/Lymphatic: The abdominal aorta and IVC are unremarkable. No portal venous gas. There is no adenopathy. Reproductive: The prostate and seminal vesicles are grossly unremarkable. No pelvic mass. Other: There is a small right inguinal hernia with extension of small amount of blood within the right inguinal canal. Musculoskeletal: Comminuted mildly displaced fracture of the right superior pubic ramus and right inferior pubic ramus with extension of the fracture into the symphysis pubis. There is a mildly displaced fracture of the posterior right pelvic bone superior to the ischial tuberosity. There is nondisplaced fracture of the superomedial right acetabular wall. There is a nondisplaced longitudinal fracture of the right sacral ala. Fractures of the right L4 and L5 transverse processes. 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 Degenerative changes of the spine. IMPRESSION: 1. Right pelvic bone fractures as described above with a small pelvic hematoma. 2. Fractures of the right L4 and L5 transverse processes. 3. No solid organ or hollow viscus injury. Electronically Signed   By: Anner Crete M.D.   On: 01/25/2020  18:57   DG Pelvis Portable  Result Date: 01/25/2020 CLINICAL DATA:  Patient fell 20 feet.  Right hip pain. EXAM: PORTABLE PELVIS 1-2 VIEWS COMPARISON:  None. FINDINGS: Proximal right femur is grossly normal although evaluation is limited due to internal rotation. Visualized lower lumbar spine and sacrum are normal. Iliac crests are normal. Apparent cortical break along the medial aspect of the right pubic ramus. No other acute abnormalities. IMPRESSION: Suggested cortical break through the medial right inferior pubic ramus suggesting a fracture. No other fractures are seen. Electronically Signed   By: Dorise Bullion III M.D   On: 01/25/2020 18:29   DG Chest Port 1 View  Result Date: 01/25/2020 CLINICAL DATA:  Patient fell 20 feet and now has right hip pain EXAM: PORTABLE CHEST 1 VIEW COMPARISON:  Chest radiograph 11/06/2019 FINDINGS: The heart size and mediastinal contours are within normal limits. Low lung volumes but otherwise clear. No pneumothorax or significant pleural effusion. No acute finding in the visualized skeleton. IMPRESSION: No acute cardiopulmonary finding. Electronically Signed   By: Audie Pinto M.D.   On: 01/25/2020 18:24    EKG: Not performed.  Assessment/Plan Principal Problem:  Closed fracture of multiple pubic rami, right, initial encounter ([place]) Active Problems:   AKI (acute kidney injury) ([place])  Danniel Fyffe is a 59 y.o. male with medical history significant recent COVID-19 pneumonia who is admitted with multiple fractures of the right pelvis after a fall.  Multiple fractures of right pelvis and L4 & L5 transverse processes after a fall from height: Occurring after deer ladder stand fell with patient on it from approximately 20 feet height.  Patient is having continued significant pain and unable to safely ambulate at this time. -Continue pain control with Tylenol, OxyIR, and IV Dilaudid as needed with hold parameters -PT/OT eval -Seen by orthopedics, Dr.  Alvan Dame, who recommend weightbearing as tolerated and follow-up outpatient in 2 weeks  AKI: Continue IV fluid hydration overnight and repeat labs in the morning.  History of COVID-19 pneumonia: Previous SARS-CoV-2 positive on 11/06/2019.  Admitted from 11/06/2019-11/08/2019 for COVID-19 pneumonia.  He was treated with IV remdesivir and Decadron and recovered well.  No acute symptoms.  Repeat SARS-CoV-2 PCR was obtained in the ED. If repeat testing does end up positive patient would not need to be placed on precaution as he has been symptom-free and out of the infectious window.  DVT prophylaxis: SCDs Code Status: Full code, confirmed with patient Family Communication: Discussed with patient's wife at bedside. Disposition Plan: From home, discharge pending adequate pain control PT/OT eval Consults called: Orthopedics Admission status:  Status is: Observation  The patient remains OBS appropriate and will d/c before 2 midnights.  Dispo: The patient is from: Home              Anticipated d/c is to: Home              Anticipated d/c date is: 1 day pending adequate pain control and PT/OT evaluation.              Patient currently is not medically stable to d/c.   Zada Finders MD [facility]  If 7PM-7AM, please contact night-coverage [email or web address]  01/25/2020, 11:39 PM

## 2020-01-25 NOTE — ED Notes (Signed)
RN and pt attempted to ambulate however was unsuccessful.  Pt was able to slowly sit on the side of the bed, even stand however he was unable to take a step and had considerable pain sitting/laying back down.  Provider aware.

## 2020-01-25 NOTE — ED Notes (Signed)
Admitting provider bedside 

## 2020-01-25 NOTE — ED Notes (Signed)
Pt to Ct with TRN

## 2020-01-25 NOTE — ED Triage Notes (Signed)
Pt fell out of deer stand around 2pm today while trying to take it down and deer stand fell out of tree with pt on it. Pt did hit hit his head but denies LOC, c.o right hip pain. Pt arrives a.o GCS 15

## 2020-01-25 NOTE — Consult Note (Signed)
Responded to phone call, met with pt's wife in [place] while he was in [place] lab.. Provided emotional/spiritual support and ministry of presence. She especially appreciated prayer. Family is [place]. Wife said pt wanted her to drive him to [facility], not stop at [place], because he had such excellent care here when he had Covid earlier in year. She will ask staff to page again if they have further need of chaplain services.  Rev. Eloise Levels Chaplain

## 2020-01-25 NOTE — Consult Note (Signed)
Reason for Consult: right hip pain Referring Physician: Tyrone Nine, DO (ER)  Robert Arroyo is an 59 y.o. male.  HPI: Pt fell out of deer stand around 2pm today while trying to take it down and deer stand fell out of tree with pt on it. Pt did hit hit his head but denies LOC, c.o right hip pain. Pt arrives a.o GCS 15  No other injuries or pain reported  Fell directly on right buttock  Past Medical History:  Diagnosis Date  . A-fib ([place]) 2003  . Hyperlipidemia   . Right inguinal hernia     Past Surgical History:  Procedure Laterality Date  . CARDIAC CATHETERIZATION  2003    Family History  Problem Relation Age of Onset  . Leukemia Mother   . Glaucoma Father     Social History:  reports that he has never smoked. He has never used smokeless tobacco. He reports current alcohol use. He reports that he does not use drugs.  Allergies: No Known Allergies  Medications: I have reviewed the patient's current medications. Scheduled:  Results for orders placed or performed during the hospital encounter of 01/25/20 (from the past 24 hour(s))  Comprehensive metabolic panel     Status: Abnormal   Collection Time: 01/25/20  6:03 PM  Result Value Ref Range   Sodium 138 135 - 145 mmol/L   Potassium 4.4 3.5 - 5.1 mmol/L   Chloride 104 98 - 111 mmol/L   CO2 24 22 - 32 mmol/L   Glucose, Bld 159 (H) 70 - 99 mg/dL   BUN 15 6 - 20 mg/dL   Creatinine, Ser 1.33 (H) 0.61 - 1.24 mg/dL   Calcium 9.0 8.9 - 10.3 mg/dL   Total Protein 6.5 6.5 - 8.1 g/dL   Albumin 3.9 3.5 - 5.0 g/dL   AST 45 (H) 15 - 41 U/L   ALT 35 0 - 44 U/L   Alkaline Phosphatase 73 38 - 126 U/L   Total Bilirubin 1.1 0.3 - 1.2 mg/dL   GFR calc non Af Amer 58 (L) >60 mL/min   GFR calc Af Amer >60 >60 mL/min   Anion gap 10 5 - 15  CBC     Status: Abnormal   Collection Time: 01/25/20  6:03 PM  Result Value Ref Range   WBC 18.3 (H) 4.0 - 10.5 K/uL   RBC 5.07 4.22 - 5.81 MIL/uL   Hemoglobin 15.1 13.0 - 17.0 g/dL   HCT 45.5 39.0  - 52.0 %   MCV 89.7 80.0 - 100.0 fL   MCH 29.8 26.0 - 34.0 pg   MCHC 33.2 30.0 - 36.0 g/dL   RDW 13.2 11.5 - 15.5 %   Platelets 228 150 - 400 K/uL   nRBC 0.0 0.0 - 0.2 %  Ethanol     Status: None   Collection Time: 01/25/20  6:03 PM  Result Value Ref Range   Alcohol, Ethyl (B) <10 <10 mg/dL  Lactic acid, plasma     Status: Abnormal   Collection Time: 01/25/20  6:03 PM  Result Value Ref Range   Lactic Acid, Venous 2.1 (HH) 0.5 - 1.9 mmol/L  Protime-INR     Status: None   Collection Time: 01/25/20  6:03 PM  Result Value Ref Range   Prothrombin Time 14.5 11.4 - 15.2 seconds   INR 1.2 0.8 - 1.2  Sample to Blood Bank     Status: None   Collection Time: 01/25/20  6:06 PM  Result Value Ref Range  Blood Bank Specimen SAMPLE AVAILABLE FOR TESTING    Sample Expiration      01/26/2020,2359 Performed at [facility], [place] [address], [place], [place] [address]   I-Stat Chem 8, ED     Status: Abnormal   Collection Time: 01/25/20  6:11 PM  Result Value Ref Range   Sodium 140 135 - 145 mmol/L   Potassium 3.8 3.5 - 5.1 mmol/L   Chloride 106 98 - 111 mmol/L   BUN 18 6 - 20 mg/dL   Creatinine, Ser 1.40 (H) 0.61 - 1.24 mg/dL   Glucose, Bld 156 (H) 70 - 99 mg/dL   Calcium, Ion 1.13 (L) 1.15 - 1.40 mmol/L   TCO2 24 22 - 32 mmol/L   Hemoglobin 14.6 13.0 - 17.0 g/dL   HCT 43.0 39.0 - 52.0 %    X-ray: CLINICAL DATA:  Patient fell 20 feet.  Right hip pain.  EXAM: PORTABLE PELVIS 1-2 VIEWS  COMPARISON:  None.  FINDINGS: Proximal right femur is grossly normal although evaluation is limited due to internal rotation. Visualized lower lumbar spine and sacrum are normal. Iliac crests are normal. Apparent cortical break along the medial aspect of the right pubic ramus. No other acute abnormalities.  IMPRESSION: Suggested cortical break through the medial right inferior pubic ramus suggesting a fracture. No other fractures are seen.   Electronically Signed   By: Dorise Bullion III M.D  ROS  Normal other than presenting complaints and injury  Blood pressure 109/73, pulse 89, temperature (!) 97.4 F (36.3 C), temperature source Temporal, resp. rate 12, height 5\' 7"  (1.702 m), weight 77.1 kg, SpO2 98 %.  Physical Exam  Awake alert Wife at bedside  No UE bruising or deformity LLE normal  Right LE pain with active and passive movement  Assessment/Plan: Right ischium fracture  Plan: Can be WBAT  If tolerates pain can leave ER otherwise admit for observation for pain control and PT RTC in 2 weeks for radiographic follow up  Mauri Pole 01/25/2020, 9:52 PM

## 2020-01-25 NOTE — ED Provider Notes (Signed)
[facility] EMERGENCY DEPARTMENT Provider Note   CSN: [id number] Arrival date & time: 01/25/20  1754     History Chief Complaint  Patient presents with  . Level 2  . Fall    Robert Arroyo is a 59 y.o. male.  59 yo M with a chief complaints of falling out of a tree stand.  The patient was trying to take it down and it actually fell off the tree with him on it.  He thinks he was about 20 feet in the air.  Landed on his face and then onto the right side.  Complaining mostly of pain to his right pelvis.  Also thinks he struck his head and complaining of some right lateral neck pain.  He denies any pain to the chest denies shortness of breath denies back pain.  Tells me that really he is cold and he is having pain to the right side of his pelvis.  Denies extremity pain.  The history is provided by the patient.  Trauma Mechanism of injury: fall Injury location: pelvis Injury location detail: R hip Incident location: woods Time since incident: 4 hours Arrived directly from scene: yes   Fall:      Fall occurred: from tree stand.      Height of fall: ~27ft      Impact surface: grass      Point of impact: right side.      Entrapped after fall: no      Suspicion of alcohol use: no      Suspicion of drug use: no  EMS/PTA data:      Ambulatory at scene: yes  Current symptoms:      Pain scale: 8/10      Pain timing: constant      Associated symptoms:            Denies abdominal pain, chest pain, headache and vomiting.       Past Medical History:  Diagnosis Date  . A-fib ([place]) 2003  . Hyperlipidemia   . Right inguinal hernia     Patient Active Problem List   Diagnosis Date Noted  . AKI (acute kidney injury) ([place])   . Pneumonia due to COVID-19 virus 11/06/2019  . Hypoxia     Past Surgical History:  Procedure Laterality Date  . CARDIAC CATHETERIZATION  2003       Family History  Problem Relation Age of Onset  . Leukemia Mother   . Glaucoma  Father     Social History   Tobacco Use  . Smoking status: Never Smoker  . Smokeless tobacco: Never Used  Substance Use Topics  . Alcohol use: Yes    Comment: red wine rarely  . Drug use: Never    Home Medications Prior to Admission medications   Medication Sig Start Date End Date Taking? Authorizing Provider  acetaminophen (TYLENOL) 500 MG tablet Take 500-1,000 mg by mouth every 6 (six) hours as needed for fever (malaise, or pain).    [provider]  aspirin EC 81 MG tablet Take 81 mg by mouth daily.    [provider]  azelastine (ASTELIN) 0.1 % nasal spray Place 2 sprays into both nostrils 2 (two) times daily. 11/03/19   [provider]  benzonatate (TESSALON) 200 MG capsule Take 200 mg by mouth 3 (three) times daily as needed for cough.  11/03/19   [provider]    Allergies    Patient has no known allergies.  Review of  Systems   Review of Systems  Constitutional: Negative for chills and fever.  HENT: Negative for congestion and facial swelling.   Eyes: Negative for discharge and visual disturbance.  Respiratory: Negative for shortness of breath.   Cardiovascular: Negative for chest pain and palpitations.  Gastrointestinal: Negative for abdominal pain, diarrhea and vomiting.  Musculoskeletal: Positive for arthralgias and myalgias.  Skin: Negative for color change and rash.  Neurological: Negative for tremors, syncope and headaches.  Psychiatric/Behavioral: Negative for confusion and dysphoric mood.    Physical Exam Updated Vital Signs BP 109/73   Pulse 89   Temp (!) 97.4 F (36.3 C) (Temporal)   Resp 12   Ht 5\' 7"  (1.702 m)   Wt 77.1 kg   SpO2 98%   BMI 26.63 kg/m   Physical Exam Vitals and nursing note reviewed.  Constitutional:      Appearance: He is well-developed.  HENT:     Head: Normocephalic and atraumatic.  Eyes:     Pupils: Pupils are equal, round, and reactive to light.  Neck:     Vascular: No JVD.    Cardiovascular:     Rate and Rhythm: Normal rate and regular rhythm.     Heart sounds: No murmur. No friction rub. No gallop.   Pulmonary:     Effort: No respiratory distress.     Breath sounds: No wheezing.  Abdominal:     General: There is no distension.     Tenderness: There is no abdominal tenderness. There is no guarding or rebound.  Musculoskeletal:        General: Normal range of motion.     Cervical back: Normal range of motion and neck supple.  Skin:    Coloration: Skin is not pale.     Findings: No rash.  Neurological:     Mental Status: He is alert and oriented to person, place, and time.  Psychiatric:        Behavior: Behavior normal.     ED Results / Procedures / Treatments   Labs (all labs ordered are listed, but only abnormal results are displayed) Labs Reviewed  COMPREHENSIVE METABOLIC PANEL - Abnormal; Notable for the following components:      Result Value   Glucose, Bld 159 (*)    Creatinine, Ser 1.33 (*)    AST 45 (*)    GFR calc non Af Amer 58 (*)    All other components within normal limits  CBC - Abnormal; Notable for the following components:   WBC 18.3 (*)    All other components within normal limits  LACTIC ACID, PLASMA - Abnormal; Notable for the following components:   Lactic Acid, Venous 2.1 (*)    All other components within normal limits  I-STAT CHEM 8, ED - Abnormal; Notable for the following components:   Creatinine, Ser 1.40 (*)    Glucose, Bld 156 (*)    Calcium, Ion 1.13 (*)    All other components within normal limits  ETHANOL  PROTIME-INR  URINALYSIS, ROUTINE W REFLEX MICROSCOPIC  SAMPLE TO BLOOD BANK    EKG None  Radiology CT HEAD WO CONTRAST  Result Date: 01/25/2020 CLINICAL DATA:  Fall from deer stand. EXAM: CT HEAD WITHOUT CONTRAST CT CERVICAL SPINE WITHOUT CONTRAST TECHNIQUE: Multidetector CT imaging of the head and cervical spine was performed following the standard protocol without intravenous contrast. Multiplanar  CT image reconstructions of the cervical spine were also generated. COMPARISON:  None. FINDINGS: CT HEAD FINDINGS Brain: No acute intracranial hemorrhage.  No focal mass lesion. No CT evidence of acute infarction. No midline shift or mass effect. No hydrocephalus. Basilar cisterns are patent. Vascular: No hyperdense vessel or unexpected calcification. Skull: Normal. Negative for fracture or focal lesion. Sinuses/Orbits: Paranasal sinuses and mastoid air cells are clear. Orbits are clear. Other: None. CT CERVICAL SPINE FINDINGS Alignment: Normal alignment of the cervical vertebral bodies. Skull base and vertebrae: Normal craniocervical junction. No loss of vertebral body height or disc height. Normal facet articulation. No evidence of fracture. Soft tissues and spinal canal: No prevertebral soft tissue swelling. No perispinal or epidural hematoma. Disc levels: There is endplate spurring from X33443. No subluxation. Upper chest: Clear Other: None IMPRESSION: 1. No intracranial trauma. 2. No cervical spine fracture. Electronically Signed   By: Suzy Bouchard M.D.   On: 01/25/2020 18:53   CT Angio Neck W and/or Wo Contrast  Result Date: 01/25/2020 CLINICAL DATA:  [place] from deer stand today.  Neck trauma blunt. EXAM: CT ANGIOGRAPHY NECK TECHNIQUE: Multidetector CT imaging of the neck was performed using the standard protocol during bolus administration of intravenous contrast. Multiplanar CT image reconstructions and MIPs were obtained to evaluate the vascular anatomy. Carotid stenosis measurements (when applicable) are obtained utilizing NASCET criteria, using the distal internal carotid diameter as the denominator. CONTRAST:  148mL OMNIPAQUE IOHEXOL 350 MG/ML SOLN COMPARISON:  None. FINDINGS: Aortic arch: Standard branching. Imaged portion shows no evidence of aneurysm or dissection. No significant stenosis of the major arch vessel origins. Right carotid system: Normal right carotid. No atherosclerotic disease  dissection or irregularity. Left carotid system: Normal left carotid. No atherosclerotic disease, dissection, or irregularity Vertebral arteries: Normal vertebral arteries bilaterally. No injury or stenosis. Skeleton: Cervical spondylosis most prominent at C4-5 and C5-6. Other neck: Negative for soft tissue mass or hematoma in the neck. Upper chest: Mild dependent atelectasis bilaterally. No effusion or pneumothorax. IMPRESSION: 1. Negative for carotid or vertebral artery injury in the neck. No significant stenosis. 2. Negative for soft tissue mass or hematoma in the neck. Electronically Signed   By: Franchot Gallo M.D.   On: 01/25/2020 19:10   CT CERVICAL SPINE WO CONTRAST  Result Date: 01/25/2020 CLINICAL DATA:  Fall from deer stand. EXAM: CT HEAD WITHOUT CONTRAST CT CERVICAL SPINE WITHOUT CONTRAST TECHNIQUE: Multidetector CT imaging of the head and cervical spine was performed following the standard protocol without intravenous contrast. Multiplanar CT image reconstructions of the cervical spine were also generated. COMPARISON:  None. FINDINGS: CT HEAD FINDINGS Brain: No acute intracranial hemorrhage. No focal mass lesion. No CT evidence of acute infarction. No midline shift or mass effect. No hydrocephalus. Basilar cisterns are patent. Vascular: No hyperdense vessel or unexpected calcification. Skull: Normal. Negative for fracture or focal lesion. Sinuses/Orbits: Paranasal sinuses and mastoid air cells are clear. Orbits are clear. Other: None. CT CERVICAL SPINE FINDINGS Alignment: Normal alignment of the cervical vertebral bodies. Skull base and vertebrae: Normal craniocervical junction. No loss of vertebral body height or disc height. Normal facet articulation. No evidence of fracture. Soft tissues and spinal canal: No prevertebral soft tissue swelling. No perispinal or epidural hematoma. Disc levels: There is endplate spurring from X33443. No subluxation. Upper chest: Clear Other: None IMPRESSION: 1. No  intracranial trauma. 2. No cervical spine fracture. Electronically Signed   By: Suzy Bouchard M.D.   On: 01/25/2020 18:53   CT ABDOMEN PELVIS W CONTRAST  Result Date: 01/25/2020 CLINICAL DATA:  59 year old male with abdominal trauma. EXAM: CT ABDOMEN AND PELVIS WITH CONTRAST TECHNIQUE: Multidetector CT  imaging of the abdomen and pelvis was performed using the standard protocol following bolus administration of intravenous contrast. CONTRAST:  150mL OMNIPAQUE IOHEXOL 350 MG/ML SOLN COMPARISON:  None. FINDINGS: Lower chest: Bibasilar atelectasis. No intra-abdominal free air. Small amount of blood within the pelvis anteriorly. Hepatobiliary: No focal liver abnormality is seen. No gallstones, gallbladder wall thickening, or biliary dilatation. Pancreas: Unremarkable. No pancreatic ductal dilatation or surrounding inflammatory changes. Spleen: Normal in size without focal abnormality. Adrenals/Urinary Tract: The adrenal glands are unremarkable. The kidneys, visualized ureters, and urinary bladder appear unremarkable. Stomach/Bowel: There is no bowel obstruction or active inflammation. The appendix is normal. Vascular/Lymphatic: The abdominal aorta and IVC are unremarkable. No portal venous gas. There is no adenopathy. Reproductive: The prostate and seminal vesicles are grossly unremarkable. No pelvic mass. Other: There is a small right inguinal hernia with extension of small amount of blood within the right inguinal canal. Musculoskeletal: Comminuted mildly displaced fracture of the right superior pubic ramus and right inferior pubic ramus with extension of the fracture into the symphysis pubis. There is a mildly displaced fracture of the posterior right pelvic bone superior to the ischial tuberosity. There is nondisplaced fracture of the superomedial right acetabular wall. There is a nondisplaced longitudinal fracture of the right sacral ala. Fractures of the right L4 and L5 transverse processes. There are  bilateral L5 pars defects. Degenerative changes of the spine. IMPRESSION: 1. Right pelvic bone fractures as described above with a small pelvic hematoma. 2. Fractures of the right L4 and L5 transverse processes. 3. No solid organ or hollow viscus injury. Electronically Signed   By: Anner Crete M.D.   On: 01/25/2020 18:57   DG Pelvis Portable  Result Date: 01/25/2020 CLINICAL DATA:  Patient fell 20 feet.  Right hip pain. EXAM: PORTABLE PELVIS 1-2 VIEWS COMPARISON:  None. FINDINGS: Proximal right femur is grossly normal although evaluation is limited due to internal rotation. Visualized lower lumbar spine and sacrum are normal. Iliac crests are normal. Apparent cortical break along the medial aspect of the right pubic ramus. No other acute abnormalities. IMPRESSION: Suggested cortical break through the medial right inferior pubic ramus suggesting a fracture. No other fractures are seen. Electronically Signed   By: Dorise Bullion III M.D   On: 01/25/2020 18:29   DG Chest Port 1 View  Result Date: 01/25/2020 CLINICAL DATA:  Patient fell 20 feet and now has right hip pain EXAM: PORTABLE CHEST 1 VIEW COMPARISON:  Chest radiograph 11/06/2019 FINDINGS: The heart size and mediastinal contours are within normal limits. Low lung volumes but otherwise clear. No pneumothorax or significant pleural effusion. No acute finding in the visualized skeleton. IMPRESSION: No acute cardiopulmonary finding. Electronically Signed   By: Audie Pinto M.D.   On: 01/25/2020 18:24    Procedures Procedures (including critical care time)  Medications Ordered in ED Medications  HYDROmorphone (DILAUDID) injection 0.5 mg (has no administration in time range)  morphine 4 MG/ML injection 4 mg (4 mg Intravenous Given 01/25/20 1812)  sodium chloride 0.9 % bolus 1,000 mL (0 mLs Intravenous Stopped 01/25/20 1926)  Tdap (BOOSTRIX) injection 0.5 mL (0.5 mLs Intramuscular Given 01/25/20 1813)  ondansetron (ZOFRAN) injection 4 mg (4  mg Intravenous Given 01/25/20 1812)  iohexol (OMNIPAQUE) 350 MG/ML injection 100 mL (100 mLs Intravenous Contrast Given 01/25/20 1817)  fentaNYL (SUBLIMAZE) injection 100 mcg (100 mcg Intravenous Given 01/25/20 2137)    ED Course  I have reviewed the triage vital signs and the nursing notes.  Pertinent labs &  imaging results that were available during my care of the patient were reviewed by me and considered in my medical decision making (see chart for details).    MDM Rules/Calculators/A&P                      59 yo M with a chief complaints of a fall from a tree stand.  Patient landed on his right side complaining mostly of right pelvic pain.  He has some lower abdominal tenderness since we will obtain a CT scan.  Also struck his head and a fall from 20 feet and has a superficial wound to the right side of his neck that has some surrounding swelling along the area would expect the carotid artery.  Will obtain a CT angiogram of the neck.  CT scan without acute pathology in the head or C-spine ordered in the arteries in the neck.  CT scan of the pelvis with pelvic fracture.  I discussed this with Dr. Alvan Dame, orthopedics.  He felt that this should be weightbearing as tolerated and follow-up in the office in a couple weeks.  Unfortunately the patient is complaining of severe pain and is unable to walk.  Will give another dose of pain medicine and then attempt to ambulate if unable will discuss with medicine for admission.  The patients results and plan were reviewed and discussed.   Any x-rays performed were independently reviewed by myself.   Differential diagnosis were considered with the presenting HPI.  Medications  HYDROmorphone (DILAUDID) injection 0.5 mg (has no administration in time range)  morphine 4 MG/ML injection 4 mg (4 mg Intravenous Given 01/25/20 1812)  sodium chloride 0.9 % bolus 1,000 mL (0 mLs Intravenous Stopped 01/25/20 1926)  Tdap (BOOSTRIX) injection 0.5 mL (0.5 mLs  Intramuscular Given 01/25/20 1813)  ondansetron (ZOFRAN) injection 4 mg (4 mg Intravenous Given 01/25/20 1812)  iohexol (OMNIPAQUE) 350 MG/ML injection 100 mL (100 mLs Intravenous Contrast Given 01/25/20 1817)  fentaNYL (SUBLIMAZE) injection 100 mcg (100 mcg Intravenous Given 01/25/20 2137)    Vitals:   01/25/20 1845 01/25/20 1900 01/25/20 1915 01/25/20 1916  BP: 127/72 120/70 109/73   Pulse: 91 90  89  Resp: 15 16  12   Temp:      TempSrc:      SpO2: 100% 100%  98%  Weight:      Height:        Final diagnoses:  Fall  Multiple closed fractures of pelvis with stable disruption of pelvic ring, initial encounter [facility])    Admission/ observation were discussed with the admitting physician, patient and/or family and they are comfortable with the plan.     Final Clinical Impression(s) / ED Diagnoses Final diagnoses:  Fall  Multiple closed fractures of pelvis with stable disruption of pelvic ring, initial encounter [facility])    Rx / [place] Orders ED Discharge Orders    None       Deno Etienne, DO 01/25/20 2248

## 2020-01-26 DIAGNOSIS — Z23 Encounter for immunization: Secondary | ICD-10-CM | POA: Diagnosis not present

## 2020-01-26 DIAGNOSIS — J302 Other seasonal allergic rhinitis: Secondary | ICD-10-CM | POA: Diagnosis present

## 2020-01-26 DIAGNOSIS — Z20822 Contact with and (suspected) exposure to covid-19: Secondary | ICD-10-CM | POA: Diagnosis present

## 2020-01-26 DIAGNOSIS — S32059A Unspecified fracture of fifth lumbar vertebra, initial encounter for closed fracture: Secondary | ICD-10-CM | POA: Diagnosis present

## 2020-01-26 DIAGNOSIS — Z7982 Long term (current) use of aspirin: Secondary | ICD-10-CM | POA: Diagnosis not present

## 2020-01-26 DIAGNOSIS — S32810A Multiple fractures of pelvis with stable disruption of pelvic ring, initial encounter for closed fracture: Secondary | ICD-10-CM | POA: Diagnosis present

## 2020-01-26 DIAGNOSIS — S329XXA Fracture of unspecified parts of lumbosacral spine and pelvis, initial encounter for closed fracture: Secondary | ICD-10-CM | POA: Diagnosis present

## 2020-01-26 DIAGNOSIS — N179 Acute kidney failure, unspecified: Secondary | ICD-10-CM | POA: Diagnosis present

## 2020-01-26 DIAGNOSIS — Z8616 Personal history of COVID-19: Secondary | ICD-10-CM | POA: Diagnosis not present

## 2020-01-26 DIAGNOSIS — R011 Cardiac murmur, unspecified: Secondary | ICD-10-CM | POA: Diagnosis present

## 2020-01-26 DIAGNOSIS — Z79899 Other long term (current) drug therapy: Secondary | ICD-10-CM | POA: Diagnosis not present

## 2020-01-26 DIAGNOSIS — W14XXXA Fall from tree, initial encounter: Secondary | ICD-10-CM | POA: Diagnosis not present

## 2020-01-26 DIAGNOSIS — Z7289 Other problems related to lifestyle: Secondary | ICD-10-CM | POA: Diagnosis not present

## 2020-01-26 DIAGNOSIS — S32119A Unspecified Zone I fracture of sacrum, initial encounter for closed fracture: Secondary | ICD-10-CM | POA: Diagnosis present

## 2020-01-26 DIAGNOSIS — S32591A Other specified fracture of right pubis, initial encounter for closed fracture: Secondary | ICD-10-CM | POA: Diagnosis not present

## 2020-01-26 DIAGNOSIS — K4090 Unilateral inguinal hernia, without obstruction or gangrene, not specified as recurrent: Secondary | ICD-10-CM | POA: Diagnosis present

## 2020-01-26 DIAGNOSIS — S32049A Unspecified fracture of fourth lumbar vertebra, initial encounter for closed fracture: Secondary | ICD-10-CM | POA: Diagnosis present

## 2020-01-26 DIAGNOSIS — E785 Hyperlipidemia, unspecified: Secondary | ICD-10-CM | POA: Diagnosis present

## 2020-01-26 LAB — CBC
HCT: 40.3 % (ref 39.0–52.0)
Hemoglobin: 13.5 g/dL (ref 13.0–17.0)
MCH: 30.5 pg (ref 26.0–34.0)
MCHC: 33.5 g/dL (ref 30.0–36.0)
MCV: 91 fL (ref 80.0–100.0)
Platelets: 174 10*3/uL (ref 150–400)
RBC: 4.43 MIL/uL (ref 4.22–5.81)
RDW: 13.6 % (ref 11.5–15.5)
WBC: 10.2 10*3/uL (ref 4.0–10.5)
nRBC: 0 % (ref 0.0–0.2)

## 2020-01-26 LAB — BASIC METABOLIC PANEL
Anion gap: 7 (ref 5–15)
BUN: 15 mg/dL (ref 6–20)
CO2: 24 mmol/L (ref 22–32)
Calcium: 8.5 mg/dL — ABNORMAL LOW (ref 8.9–10.3)
Chloride: 106 mmol/L (ref 98–111)
Creatinine, Ser: 1.17 mg/dL (ref 0.61–1.24)
GFR calc Af Amer: >60 mL/min (ref >60)
GFR calc non Af Amer: >60 mL/min (ref >60)
Glucose, Bld: 142 mg/dL — ABNORMAL HIGH (ref 70–99)
Potassium: 4.4 mmol/L (ref 3.5–5.1)
Sodium: 137 mmol/L (ref 135–145)

## 2020-01-26 LAB — SARS CORONAVIRUS 2 BY RT PCR (HOSPITAL ORDER, PERFORMED IN ~~LOC~~ HOSPITAL LAB): SARS Coronavirus 2: NEGATIVE

## 2020-01-26 MED ORDER — IBUPROFEN 200 MG PO TABS
200.0000 mg | ORAL_TABLET | Freq: Four times a day (QID) | ORAL | Status: DC | PRN
  Administered 2020-01-26: 200 mg via ORAL
  Filled 2020-01-26: qty 1

## 2020-01-26 MED ORDER — METHOCARBAMOL 500 MG PO TABS: 500.0000 mg | ORAL_TABLET | Freq: Three times a day (TID) | ORAL | Status: DC | PRN

## 2020-01-26 MED ORDER — ENOXAPARIN SODIUM 40 MG/0.4ML ~~LOC~~ SOLN
40.0000 mg | SUBCUTANEOUS | Status: DC
  Administered 2020-01-26 – 2020-01-27 (×2): 40 mg via SUBCUTANEOUS
  Filled 2020-01-26 (×2): qty 0.4

## 2020-01-26 NOTE — Progress Notes (Signed)
Subjective: 59 y/o male admitted yesterday after a fall from a deer stand about 20'.  He c/o deep pain in the right groin and buttock.  He has been able to sit up to urinate.  He was able to stand last evening but has not walked yet.  Awaiting PT today.  He is comfortable when lying down.  He is tolerating a regular diet.  No h/o injury / surgery to the right hip / pelvis.  He is not diabetic and is not a smoker.  Pain is worse with ROM and better with rest.   Objective: Vital signs in last 24 hours: Temp:  [96.5 F (35.8 C)-99.3 F (37.4 C)] 99.3 F (37.4 C) (05/31 0748) Pulse Rate:  [78-95] 94 (05/31 0748) Resp:  [8-29] 16 (05/31 0748) BP: (92-130)/(60-78) 106/78 (05/31 0748) SpO2:  [91 %-100 %] 93 % (05/31 0748) Weight:  [77.1 kg] 77.1 kg (05/30 1800)  Intake/Output from previous day: 05/30 0701 - 05/31 0700 In: 1535.9 [P.O.:200; I.V.:335.9; IV Piggyback:1000] Out: 0  Intake/Output this shift: No intake/output data recorded.  Recent Labs    01/25/20 1803 01/25/20 1811 01/26/20 0334  HGB 15.1 14.6 13.5   Recent Labs    01/25/20 1803 01/25/20 1803 01/25/20 1811 01/26/20 0334  WBC 18.3*  --   --  10.2  RBC 5.07  --   --  4.43  HCT 45.5   < > 43.0 40.3  PLT 228  --   --  174   < > = values in this interval not displayed.   Recent Labs    01/25/20 1803 01/25/20 1803 01/25/20 1811 01/26/20 0334  NA 138   < > 140 137  K 4.4   < > 3.8 4.4  CL 104   < > 106 106  CO2 24  --   --  24  BUN 15   < > 18 15  CREATININE 1.33*   < > 1.40* 1.17  GLUCOSE 159*   < > 156* 142*  CALCIUM 9.0  --   --  8.5*   < > = values in this interval not displayed.   Recent Labs    01/25/20 1803  INR 1.2    PE:  wn wd male in nad.  A and O x 4.  Mood and affect normal.  EOMI.  resp unlabored.  R LE without gross abnormality.  Skin healthy and intact at the R LE>  2+ dp pulse.  5/5 strength in PF and DF of the ankle and toes.  Sens to LT itnact at the dorsal and plantar forefoot.  No  lymphadenopathy at the  R LE.    Assessment/Plan: R nondisplaced pubic ramus and ischium fractures - these are stable fractures that can be treated safely in closed fashion.  The patient can bear weight as tolerated on the R LE.  Recommend PT for ambulation.  Pain control as needed.  F/u in the office in two weeks for repeat xrays.  The patient and his wife understand the plan and agree.     Wylene Simmer 01/26/2020, 9:35 AM

## 2020-01-26 NOTE — Evaluation (Signed)
Physical Therapy Evaluation Patient Details Name: Robert Arroyo MRN: [id number] DOB: 1960-09-30 Today's Date: 01/26/2020   History of Present Illness  Robert Arroyo is a 59 y/o male who presents to the ED s/p fall from deer stand and R hip pain. He was found to have a R inferior pubic ramus fracture and is WBAT. PMH siginifant for admission 3/11-3/13 for COVID PNA but has been doing well since, a-fib, hyperlipidemia, right inguinal hernia, and cardiac catheterization, and COVID-19 pneumonia.     Clinical Impression  This patient presents with acute pain and decreased functional independence following the above mentioned procedure. At the time of PT eval, pt very painful even with premedication and required increased time and +2 assist for basic transfers. We were not able to progress to ambulation at this time. Recommend at least one more day to work with therapy and progress mobility. Based on performance during eval, I do not feel this patient is ready for a car transfer or to attempt negotiating stairs from a safety perspective. Acutely, this patient is appropriate for skilled PT interventions to address functional limitations, improve safety and independence with functional mobility, and return to PLOF.     Follow Up Recommendations Home health PT;Supervision for mobility/OOB    Equipment Recommendations  None recommended by PT    Recommendations for Other Services       Precautions / Restrictions Precautions Precautions: Fall Restrictions Weight Bearing Restrictions: Yes Other Position/Activity Restrictions: WBAT      Mobility  Bed Mobility Overal bed mobility: Needs Assistance Bed Mobility: Supine to Sit     Supine to sit: Mod assist;+2 for physical assistance;+2 for safety/equipment;HOB elevated     General bed mobility comments: mod A +2 for RLE management and to progress trunk to upright posture, pt required increased time and effort to progress hips to  EOB  Transfers Overall transfer level: Needs assistance Equipment used: Rolling walker (2 wheeled) Transfers: Sit to/from [facility] Sit to Stand: Mod assist;+2 physical assistance;+2 safety/equipment Stand pivot transfers: Min assist;+2 physical assistance;+2 safety/equipment       General transfer comment: mod A +2 to powerup into standing, cues for safe hand placement;minA+2 for stand-pivot transfer to recliner  Ambulation/Gait                Stairs            Wheelchair Mobility    Modified Rankin (Stroke Patients Only)       Balance Overall balance assessment: Needs assistance Sitting-balance support: No upper extremity supported;Feet supported Sitting balance-Leahy Scale: Fair Sitting balance - Comments: able to sit without UE support, demonstrates preference for pain management   Standing balance support: Bilateral upper extremity supported;During functional activity Standing balance-Leahy Scale: Poor Standing balance comment: reliant on BUE support On RW                             Pertinent Vitals/Pain Pain Assessment: 0-10 Pain Score: 2 (at rest) Faces Pain Scale: Hurts whole lot(during mobility) Pain Location: R hip;faces indicate increased pain with movement Pain Descriptors / Indicators: Tender;Grimacing Pain Intervention(s): Limited activity within patient's tolerance;Monitored during session;Repositioned    Home Living Family/patient expects to be discharged to:: Private residence Living Arrangements: Spouse/significant other Available Help at Discharge: Family;Available 24 hours/day Type of Home: House Home Access: Stairs to enter Entrance Stairs-Rails: Right;Left;Can reach both(back) Entrance Stairs-Number of Steps: 3 at the carport, 2 steps at the back Home Layout:  One level Home Equipment: Cane - single point;Walker - 2 wheels;Wheelchair - Brewing technologist;Toilet riser Additional Comments: Builds  furniture     Prior Function Level of Independence: Independent               Hand Dominance   Dominant Hand: Right    Extremity/Trunk Assessment   Upper Extremity Assessment Upper Extremity Assessment: Overall WFL for tasks assessed    Lower Extremity Assessment Lower Extremity Assessment: RLE deficits/detail RLE Deficits / Details: limited by pain, required assistance with RLE adduction     Cervical / Trunk Assessment Cervical / Trunk Assessment: Normal  Communication   Communication: No difficulties  Cognition Arousal/Alertness: Awake/alert Behavior During Therapy: WFL for tasks assessed/performed Overall Cognitive Status: Within Functional Limits for tasks assessed                                        General Comments General comments (skin integrity, edema, etc.): educated pt on concussion management strategies     Exercises     Assessment/Plan    PT Assessment Patient needs continued PT services  PT Problem List Decreased strength;Decreased range of motion;Decreased activity tolerance;Decreased balance;Decreased mobility;Decreased knowledge of use of DME;Decreased safety awareness;Decreased knowledge of precautions;Pain       PT Treatment Interventions DME instruction;Gait training;Stair training;Functional mobility training;Therapeutic activities;Therapeutic exercise;Neuromuscular re-education;Patient/family education    PT Goals (Current goals can be found in the Care Plan section)  Acute Rehab PT Goals Patient Stated Goal: to go home soon;manage pain PT Goal Formulation: With patient/family Time For Goal Achievement: 02/02/20 Potential to Achieve Goals: Good    Frequency Min 5X/week   Barriers to discharge        Co-evaluation PT/OT/SLP Co-Evaluation/Treatment: Yes Reason for Co-Treatment: For patient/therapist safety;To address functional/ADL transfers PT goals addressed during session: Mobility/safety with  mobility;Balance;Proper use of DME;Strengthening/ROM OT goals addressed during session: ADL's and self-care       AM-PAC PT "6 Clicks" Mobility  Outcome Measure Help needed turning from your back to your side while in a flat bed without using bedrails?: A Little Help needed moving from lying on your back to sitting on the side of a flat bed without using bedrails?: A Little Help needed moving to and from a bed to a chair (including a wheelchair)?: A Lot Help needed standing up from a chair using your arms (e.g., wheelchair or bedside chair)?: A Little Help needed to walk in hospital room?: A Lot Help needed climbing 3-5 steps with a railing? : A Lot 6 Click Score: 15    End of Session Equipment Utilized During Treatment: Gait belt Activity Tolerance: Patient tolerated treatment well Patient left: in chair;with call bell/phone within reach;with family/visitor present Nurse Communication: Mobility status PT Visit Diagnosis: Unsteadiness on feet (R26.81);Pain;Other abnormalities of gait and mobility (R26.89) Pain - Right/Left: Right Pain - part of body: Hip    Time: 1005-1036 PT Time Calculation (min) (ACUTE ONLY): 31 min   Charges:   PT Evaluation $PT Eval Moderate Complexity: 1 Mod          Rolinda Roan, PT, DPT Acute Rehabilitation Services Pager: [phone] Office: [phone]   Thelma Comp 01/26/2020, 11:55 AM

## 2020-01-26 NOTE — Evaluation (Signed)
Occupational Therapy Evaluation Patient Details Name: Robert Arroyo MRN: [id number] DOB: Jan 13, 1961 Today's Date: 01/26/2020    History of Present Illness Robert Arroyo is a 59 y.o. male with medical history significant recent COVID-19 pneumonia who presents to the ED for evaluation of right hip pain after a fall from a deer stand about 20 feet. PMH siginifant for a-fib, hyperlipidemia, right inguinal hernia, and cardiac catheterization, and COVID-19 pneumonia.    Clinical Impression   PTA, pt was living at home with his spouse, pt reports he was independent with ADL/IADL and functional mobility and builds furniture. Pt currently limited by pain. He requires modA+2 for sit<>stand and minA+2 for stand-pivot transfer. Pt would benefit from additional night to better manage pain and progress mobility. Anticipate with pain managed, pt will not need additional OT follow-up. Will continue to follow acutely to progress pt toward prior level of functioning.       Follow Up Recommendations  No OT follow up    Equipment Recommendations  3 in 1 bedside commode    Recommendations for Other Services       Precautions / Restrictions Precautions Precautions: Fall Restrictions Weight Bearing Restrictions: Yes Other Position/Activity Restrictions: WBAT      Mobility Bed Mobility Overal bed mobility: Needs Assistance Bed Mobility: Supine to Sit     Supine to sit: Mod assist;+2 for physical assistance;+2 for safety/equipment;HOB elevated     General bed mobility comments: modA+2 for RLE management and to progress trunk to upright posture, pt required increased time and effort to progress hips to EOB  Transfers Overall transfer level: Needs assistance Equipment used: Rolling walker (2 wheeled) Transfers: Sit to/from [facility] Sit to Stand: Mod assist;+2 physical assistance;+2 safety/equipment Stand pivot transfers: Min assist;+2 physical assistance;+2  safety/equipment       General transfer comment: modA+2 to powerup into standing, cues for safe hand placement;minA+2 for stand-pivot transfer to recliner    Balance Overall balance assessment: Needs assistance Sitting-balance support: No upper extremity supported;Feet supported Sitting balance-Leahy Scale: Fair Sitting balance - Comments: able to sit without UE support, demonstrates preference for pain management   Standing balance support: Bilateral upper extremity supported;During functional activity Standing balance-Leahy Scale: Poor Standing balance comment: reliant on BUE support On RW                           ADL either performed or assessed with clinical judgement   ADL Overall ADL's : Needs assistance/impaired Eating/Feeding: Set up;Sitting   Grooming: Set up;Sitting   Upper Body Bathing: Set up;Sitting   Lower Body Bathing: Moderate assistance;+2 for physical assistance;+2 for safety/equipment   Upper Body Dressing : Set up;Sitting   Lower Body Dressing: Moderate assistance;+2 for physical assistance;+2 for safety/equipment   Toilet Transfer: Minimal assistance;+2 for physical assistance;+2 for safety/equipment;Stand-pivot Toilet Transfer Details (indicate cue type and reason): stand pivot to recliner;cues for sequencing Toileting- Clothing Manipulation and Hygiene: Moderate assistance;+2 for physical assistance;+2 for safety/equipment Toileting - Clothing Manipulation Details (indicate cue type and reason): modA to powerup into standing;vc for safe hand placement     Functional mobility during ADLs: Moderate assistance;+2 for physical assistance;+2 for safety/equipment;Rolling walker General ADL Comments: pt limited by pain     Vision Baseline Vision/History: Wears glasses Wears Glasses: At all times Patient Visual Report: No change from baseline       Perception     Praxis      Pertinent Vitals/Pain Pain Assessment: 0-10 Pain Score:  2   Faces Pain Scale: Hurts whole lot Pain Location: R hip;faces indicate increased pain with movement Pain Descriptors / Indicators: Tender;Grimacing Pain Intervention(s): Limited activity within patient's tolerance;Monitored during session;Repositioned     Hand Dominance Right   Extremity/Trunk Assessment Upper Extremity Assessment Upper Extremity Assessment: Overall WFL for tasks assessed   Lower Extremity Assessment Lower Extremity Assessment: Defer to PT evaluation;RLE deficits/detail RLE Deficits / Details: limited by pain, required assistance with RLE adduction    Cervical / Trunk Assessment Cervical / Trunk Assessment: Normal   Communication Communication Communication: No difficulties   Cognition Arousal/Alertness: Awake/alert Behavior During Therapy: WFL for tasks assessed/performed Overall Cognitive Status: Within Functional Limits for tasks assessed                                     General Comments  educated pt on concussion management strategies     Exercises     Shoulder Instructions      Home Living Family/patient expects to be discharged to:: Private residence Living Arrangements: Spouse/significant other Available Help at Discharge: Family;Available 24 hours/day Type of Home: House Home Access: Stairs to enter [employer] of Steps: 3 at the carport, 2 steps at the back Entrance Stairs-Rails: Right;Left;Can reach both(back) Home Layout: One level     Bathroom Shower/Tub: Walk-in shower;Tub/shower unit   Bathroom Toilet: Standard Bathroom Accessibility: Yes   Home Equipment: Cane - single point;Walker - 2 wheels;Wheelchair - Brewing technologist;Toilet riser   Additional Comments: Builds furniture       Prior Functioning/Environment Level of Independence: Independent                 OT Problem List: Decreased strength;Decreased range of motion;Decreased activity tolerance;Impaired balance (sitting and/or  standing);Decreased safety awareness;Pain      OT Treatment/Interventions: Self-care/ADL training;Therapeutic exercise;DME and/or AE instruction;Therapeutic activities;Patient/family education;Balance training    OT Goals(Current goals can be found in the care plan section) Acute Rehab OT Goals Patient Stated Goal: to go home soon;manage pain OT Goal Formulation: With patient Time For Goal Achievement: 02/09/20 Potential to Achieve Goals: Good ADL Goals Pt Will Perform Grooming: with supervision;standing Pt Will Perform Lower Body Dressing: with min assist;with adaptive equipment;sit to/from stand Pt Will Transfer to Toilet: with min guard assist;ambulating Pt Will Perform Toileting - Clothing Manipulation and hygiene: with supervision;sit to/from stand Pt Will Perform Tub/Shower Transfer: with supervision;Shower transfer  OT Frequency: Min 2X/week   Barriers to D/C: Inaccessible home environment  pt has 2 steps into his home       Co-evaluation PT/OT/SLP Co-Evaluation/Treatment: Yes Reason for Co-Treatment: For patient/therapist safety;To address functional/ADL transfers   OT goals addressed during session: ADL's and self-care      AM-PAC OT "6 Clicks" Daily Activity     Outcome Measure Help from another person eating meals?: A Little Help from another person taking care of personal grooming?: A Little Help from another person toileting, which includes using toliet, bedpan, or urinal?: A Lot Help from another person bathing (including washing, rinsing, drying)?: A Little Help from another person to put on and taking off regular upper body clothing?: A Little Help from another person to put on and taking off regular lower body clothing?: A Lot 6 Click Score: 16   End of Session Equipment Utilized During Treatment: Gait belt;Rolling walker Nurse Communication: Mobility status  Activity Tolerance: Patient tolerated treatment well;Patient limited by pain Patient left: in  chair;with call bell/phone within reach;with family/visitor present  OT Visit Diagnosis: Unsteadiness on feet (R26.81);Other abnormalities of gait and mobility (R26.89);History of falling (Z91.81);Pain Pain - Right/Left: Right Pain - part of body: Hip                Time: [id number] OT Time Calculation (min): 29 min Charges:  OT General Charges $OT Visit: 1 Visit OT Evaluation $OT Eval Moderate Complexity: [place] OTR/L Acute Rehabilitation Services Office: [place] 01/26/2020, 11:01 AM

## 2020-01-26 NOTE — Progress Notes (Signed)
PROGRESS NOTE  Robert Arroyo CX:[id number] DOB: 1961-05-31 DOA: 01/25/2020 PCP: Rochel Brome, MD   LOS: 0 days   Brief Narrative / Interim history: 59 year old male with recent COVID-19 pneumonia fully recovered came to the ED for right hip pain after having a fall. CT abdomen/pelvis shows comminuted mildly displaced fracture of the right superior pubic ramus and right inferior pubic ramus with extension into the symphysis pubis.  Mildly displaced fracture of posterior right pelvic bone superior to the ischial tuberosity and nondisplaced fracture of the superior medial right acetabular wall are seen.  Nondisplaced longitudinal fracture of the right sacral ala and right L4 and L5 transverse processes fractures also seen.  Subjective / 24h Interval events: - no chest pain, shortness of breath, no abdominal pain, nausea or vomiting.  No pelvic pain while at rest but does hurt with movement  Assessment & Plan: Principal Problem Multiple fractures of the right pelvis and L4/L5 transverse processes fall from height - occurring after a deer ladder stand with patient on it fell approximately 20 feet.  Orthopedic surgery consulted and recommended nonoperative management with repeat imaging in 2 weeks in office -Still has not mobilized much, occupational therapy saw patient this morning and his mobility was significantly limited by pain, requiring moderate assist for assist/stand, recommending an additional night stay for improved pain control and repeat therapy session -Overnight he has continued to use IV Dilaudid, have encouraged to use orals today and see if his pain is improved.  Awaiting physical therapy evaluation  Active Problems Acute kidney injury-creatinine has normalized with fluids  Recent COVID-19 pneumonia-was admitted in March, status post treatment with remdesivir and Decadron  Scheduled Meds: Continuous Infusions: PRN Meds:.acetaminophen **OR** acetaminophen, HYDROmorphone  (DILAUDID) injection, ondansetron **OR** ondansetron (ZOFRAN) IV, oxyCODONE, senna-docusate  DVT prophylaxis: Lovenox Code Status: Full code Family Communication: no family at bedside   Status is: Observation  The patient will require care spanning > 2 midnights and should be moved to inpatient because: Remains with poorly controlled pain, barely able to mobilize, unsafe home discharge until his pain regimen is optimized, will need to work with physical therapy again tomorrow to ensure safety upon discharge  Dispo: The patient is from: Home              Anticipated d/c is to: Home              Anticipated d/c date is: 1 day              Patient currently is not medically stable to d/c.  Consultants:  Orthopedic surgery   Procedures:  None   Microbiology  None   Antimicrobials: None     Objective: Vitals:   01/26/20 0030 01/26/20 0052 01/26/20 0510 01/26/20 0748  BP:  122/74 119/60 106/78  Pulse:  87 83 94  Resp:    16  Temp: (!) 96.5 F (35.8 C) 99 F (37.2 C) 98.6 F (37 C) 99.3 F (37.4 C)  TempSrc: Temporal Oral Oral Oral  SpO2:  96% 94% 93%  Weight:      Height:        Intake/Output Summary (Last 24 hours) at 01/26/2020 1148 Last data filed at 01/26/2020 1100 Gross per 24 hour  Intake 1775.93 ml  Output 0 ml  Net 1775.93 ml   Filed Weights   01/25/20 1800  Weight: 77.1 kg    Examination:  Constitutional: NAD Eyes: no scleral icterus ENMT: Mucous membranes are moist.  Neck: normal, supple Respiratory: clear to  auscultation bilaterally, no wheezing, no crackles. Normal respiratory effort. No accessory muscle use.  Cardiovascular: Regular rate and rhythm, no murmurs / rubs / gallops. No LE edema. Good peripheral pulses Abdomen: non distended, no tenderness. Bowel sounds positive.  Musculoskeletal: no clubbing / cyanosis.  Skin: no rashes Neurologic: CN 2-12 grossly intact. Strength 5/5 in all 4.   Data Reviewed: I have independently reviewed  following labs and imaging studies   CBC: Recent Labs  Lab 01/25/20 1803 01/25/20 1811 01/26/20 0334  WBC 18.3*  --  10.2  HGB 15.1 14.6 13.5  HCT 45.5 43.0 40.3  MCV 89.7  --  91.0  PLT 228  --  [id number]   Basic Metabolic Panel: Recent Labs  Lab 01/25/20 1803 01/25/20 1811 01/26/20 0334  NA 138 140 137  K 4.4 3.8 4.4  CL 104 106 106  CO2 24  --  24  GLUCOSE 159* 156* 142*  BUN 15 18 15   CREATININE 1.33* 1.40* 1.17  CALCIUM 9.0  --  8.5*   Liver Function Tests: Recent Labs  Lab 01/25/20 1803  AST 45*  ALT 35  ALKPHOS 73  BILITOT 1.1  PROT 6.5  ALBUMIN 3.9   Coagulation Profile: Recent Labs  Lab 01/25/20 1803  INR 1.2   HbA1C: No results for input(s): HGBA1C in the last 72 hours. CBG: No results for input(s): GLUCAP in the last 168 hours.  Recent Results (from the past 240 hour(s))  SARS Coronavirus 2 by RT PCR (hospital order, performed in [facility] hospital lab) Nasopharyngeal Nasopharyngeal Swab     Status: None   Collection Time: 01/25/20 11:00 PM   Specimen: Nasopharyngeal Swab  Result Value Ref Range Status   SARS Coronavirus 2 NEGATIVE NEGATIVE Final    Comment: (NOTE) SARS-CoV-2 target nucleic acids are NOT DETECTED. The SARS-CoV-2 RNA is generally detectable in upper and lower respiratory specimens during the acute phase of infection. The lowest concentration of SARS-CoV-2 viral copies this assay can detect is 250 copies / mL. A negative result does not preclude SARS-CoV-2 infection and should not be used as the sole basis for treatment or other patient management decisions.  A negative result may occur with improper specimen collection / handling, submission of specimen other than nasopharyngeal swab, presence of viral mutation(s) within the areas targeted by this assay, and inadequate number of viral copies (<250 copies / mL). A negative result must be combined with clinical observations, patient history, and epidemiological  information. Fact Sheet for Patients:   StrictlyIdeas.no Fact Sheet for Healthcare Providers: BankingDealers.co.za This test is not yet approved or cleared  by the [place] FDA and has been authorized for detection and/or diagnosis of SARS-CoV-2 by FDA under an Emergency Use Authorization (EUA).  This EUA will remain in effect (meaning this test can be used) for the duration of the COVID-19 declaration under Section 564(b)(1) of the Act, 21 U.S.C. section 360bbb-3(b)(1), unless the authorization is terminated or revoked sooner. Performed at [facility], [address] [place], [place] [address]      Radiology Studies: CT HEAD WO CONTRAST  Result Date: 01/25/2020 CLINICAL DATA:  Fall from deer stand. EXAM: CT HEAD WITHOUT CONTRAST CT CERVICAL SPINE WITHOUT CONTRAST TECHNIQUE: Multidetector CT imaging of the head and cervical spine was performed following the standard protocol without intravenous contrast. Multiplanar CT image reconstructions of the cervical spine were also generated. COMPARISON:  None. FINDINGS: CT HEAD FINDINGS Brain: No acute intracranial hemorrhage. No focal mass lesion. No  CT evidence of acute infarction. No midline shift or mass effect. No hydrocephalus. Basilar cisterns are patent. Vascular: No hyperdense vessel or unexpected calcification. Skull: Normal. Negative for fracture or focal lesion. Sinuses/Orbits: Paranasal sinuses and mastoid air cells are clear. Orbits are clear. Other: None. CT CERVICAL SPINE FINDINGS Alignment: Normal alignment of the cervical vertebral bodies. Skull base and vertebrae: Normal craniocervical junction. No loss of vertebral body height or disc height. Normal facet articulation. No evidence of fracture. Soft tissues and spinal canal: No prevertebral soft tissue swelling. No perispinal or epidural hematoma. Disc levels: There is endplate spurring from [id number]. No subluxation. Upper chest:  Clear Other: None IMPRESSION: 1. No intracranial trauma. 2. No cervical spine fracture. Electronically Signed   By: Suzy Bouchard M.D.   On: 01/25/2020 18:53   CT Angio Neck W and/or Wo Contrast  Result Date: 01/25/2020 CLINICAL DATA:  [place] from deer stand today.  Neck trauma blunt. EXAM: CT ANGIOGRAPHY NECK TECHNIQUE: Multidetector CT imaging of the neck was performed using the standard protocol during bolus administration of intravenous contrast. Multiplanar CT image reconstructions and MIPs were obtained to evaluate the vascular anatomy. Carotid stenosis measurements (when applicable) are obtained utilizing NASCET criteria, using the distal internal carotid diameter as the denominator. CONTRAST:  137mL OMNIPAQUE IOHEXOL 350 MG/ML SOLN COMPARISON:  None. FINDINGS: Aortic arch: Standard branching. Imaged portion shows no evidence of aneurysm or dissection. No significant stenosis of the major arch vessel origins. Right carotid system: Normal right carotid. No atherosclerotic disease dissection or irregularity. Left carotid system: Normal left carotid. No atherosclerotic disease, dissection, or irregularity Vertebral arteries: Normal vertebral arteries bilaterally. No injury or stenosis. Skeleton: Cervical spondylosis most prominent at C4-5 and C5-6. Other neck: Negative for soft tissue mass or hematoma in the neck. Upper chest: Mild dependent atelectasis bilaterally. No effusion or pneumothorax. IMPRESSION: 1. Negative for carotid or vertebral artery injury in the neck. No significant stenosis. 2. Negative for soft tissue mass or hematoma in the neck. Electronically Signed   By: Franchot Gallo M.D.   On: 01/25/2020 19:10   CT CERVICAL SPINE WO CONTRAST  Result Date: 01/25/2020 CLINICAL DATA:  Fall from deer stand. EXAM: CT HEAD WITHOUT CONTRAST CT CERVICAL SPINE WITHOUT CONTRAST TECHNIQUE: Multidetector CT imaging of the head and cervical spine was performed following the standard protocol without  intravenous contrast. Multiplanar CT image reconstructions of the cervical spine were also generated. COMPARISON:  None. FINDINGS: CT HEAD FINDINGS Brain: No acute intracranial hemorrhage. No focal mass lesion. No CT evidence of acute infarction. No midline shift or mass effect. No hydrocephalus. Basilar cisterns are patent. Vascular: No hyperdense vessel or unexpected calcification. Skull: Normal. Negative for fracture or focal lesion. Sinuses/Orbits: Paranasal sinuses and mastoid air cells are clear. Orbits are clear. Other: None. CT CERVICAL SPINE FINDINGS Alignment: Normal alignment of the cervical vertebral bodies. Skull base and vertebrae: Normal craniocervical junction. No loss of vertebral body height or disc height. Normal facet articulation. No evidence of fracture. Soft tissues and spinal canal: No prevertebral soft tissue swelling. No perispinal or epidural hematoma. Disc levels: There is endplate spurring from [id number]. No subluxation. Upper chest: Clear Other: None IMPRESSION: 1. No intracranial trauma. 2. No cervical spine fracture. Electronically Signed   By: Suzy Bouchard M.D.   On: 01/25/2020 18:53   CT ABDOMEN PELVIS W CONTRAST  Result Date: 01/25/2020 CLINICAL DATA:  59 year old male with abdominal trauma. EXAM: CT ABDOMEN AND PELVIS WITH CONTRAST TECHNIQUE: Multidetector CT imaging of the abdomen and  pelvis was performed using the standard protocol following bolus administration of intravenous contrast. CONTRAST:  137mL OMNIPAQUE IOHEXOL 350 MG/ML SOLN COMPARISON:  None. FINDINGS: Lower chest: Bibasilar atelectasis. No intra-abdominal free air. Small amount of blood within the pelvis anteriorly. Hepatobiliary: No focal liver abnormality is seen. No gallstones, gallbladder wall thickening, or biliary dilatation. Pancreas: Unremarkable. No pancreatic ductal dilatation or surrounding inflammatory changes. Spleen: Normal in size without focal abnormality. Adrenals/Urinary Tract: The adrenal  glands are unremarkable. The kidneys, visualized ureters, and urinary bladder appear unremarkable. Stomach/Bowel: There is no bowel obstruction or active inflammation. The appendix is normal. Vascular/Lymphatic: The abdominal aorta and IVC are unremarkable. No portal venous gas. There is no adenopathy. Reproductive: The prostate and seminal vesicles are grossly unremarkable. No pelvic mass. Other: There is a small right inguinal hernia with extension of small amount of blood within the right inguinal canal. Musculoskeletal: Comminuted mildly displaced fracture of the right superior pubic ramus and right inferior pubic ramus with extension of the fracture into the symphysis pubis. There is a mildly displaced fracture of the posterior right pelvic bone superior to the ischial tuberosity. There is nondisplaced fracture of the superomedial right acetabular wall. There is a nondisplaced longitudinal fracture of the right sacral ala. Fractures of the right L4 and L5 transverse processes. 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 Degenerative changes of the spine. IMPRESSION: 1. Right pelvic bone fractures as described above with a small pelvic hematoma. 2. Fractures of the right L4 and L5 transverse processes. 3. No solid organ or hollow viscus injury. Electronically Signed   By: Anner Crete M.D.   On: 01/25/2020 18:57   DG Pelvis Portable  Result Date: 01/25/2020 CLINICAL DATA:  Patient fell 20 feet.  Right hip pain. EXAM: PORTABLE PELVIS 1-2 VIEWS COMPARISON:  None. FINDINGS: Proximal right femur is grossly normal although evaluation is limited due to internal rotation. Visualized lower lumbar spine and sacrum are normal. Iliac crests are normal. Apparent cortical break along the medial aspect of the right pubic ramus. No other acute abnormalities. IMPRESSION: Suggested cortical break through the medial right inferior pubic ramus suggesting a fracture. No other fractures are seen. Electronically Signed   By: Dorise Bullion III M.D   On: 01/25/2020 18:29   DG Chest Port 1 View  Result Date: 01/25/2020 CLINICAL DATA:  Patient fell 20 feet and now has right hip pain EXAM: PORTABLE CHEST 1 VIEW COMPARISON:  Chest radiograph 11/06/2019 FINDINGS: The heart size and mediastinal contours are within normal limits. Low lung volumes but otherwise clear. No pneumothorax or significant pleural effusion. No acute finding in the visualized skeleton. IMPRESSION: No acute cardiopulmonary finding. Electronically Signed   By: Audie Pinto M.D.   On: 01/25/2020 18:24    Marzetta Board, MD, PhD [facility]  Between 7 am - 7 pm I am available, please contact me via Amion or Securechat  Between 7 pm - 7 am I am not available, please contact night coverage MD/APP via Amion

## 2020-01-27 ENCOUNTER — Encounter (HOSPITAL_COMMUNITY): Payer: Self-pay | Admitting: Internal Medicine

## 2020-01-27 DIAGNOSIS — S32599A Other specified fracture of unspecified pubis, initial encounter for closed fracture: Secondary | ICD-10-CM

## 2020-01-27 HISTORY — DX: Other specified fracture of unspecified pubis, initial encounter for closed fracture: S32.599A

## 2020-01-27 MED ORDER — IBUPROFEN 200 MG PO TABS: 200.0000 mg | ORAL_TABLET | Freq: Four times a day (QID) | ORAL | 0 refills | Status: DC | PRN

## 2020-01-27 MED ORDER — METHOCARBAMOL 500 MG PO TABS: 500.0000 mg | ORAL_TABLET | Freq: Three times a day (TID) | ORAL | 0 refills | Status: DC | PRN

## 2020-01-27 MED ORDER — OXYCODONE HCL 10 MG PO TABS: 10.0000 mg | ORAL_TABLET | Freq: Four times a day (QID) | ORAL | 0 refills | Status: AC | PRN

## 2020-01-27 NOTE — Plan of Care (Signed)
°  Problem: Education: Goal: Knowledge of General Education information will improve Description: Including pain rating scale, medication(s)/side effects and non-pharmacologic comfort measures 01/27/2020 0751 by Baldomero Lamy, RN Outcome: Progressing 01/27/2020 0751 by Baldomero Lamy, RN Outcome: Progressing   Problem: Health Behavior/Discharge Planning: Goal: Ability to manage health-related needs will improve 01/27/2020 0751 by Baldomero Lamy, RN Outcome: Progressing 01/27/2020 0751 by Baldomero Lamy, RN Outcome: Progressing   Problem: Clinical Measurements: Goal: Ability to maintain clinical measurements within normal limits will improve 01/27/2020 0751 by Baldomero Lamy, RN Outcome: Progressing 01/27/2020 0751 by Baldomero Lamy, RN Outcome: Progressing Goal: Will remain free from infection 01/27/2020 0751 by Baldomero Lamy, RN Outcome: Progressing 01/27/2020 0751 by Baldomero Lamy, RN Outcome: Progressing Goal: Diagnostic test results will improve 01/27/2020 0751 by Baldomero Lamy, RN Outcome: Progressing 01/27/2020 0751 by Baldomero Lamy, RN Outcome: Progressing Goal: Respiratory complications will improve 01/27/2020 0751 by Baldomero Lamy, RN Outcome: Progressing 01/27/2020 0751 by Baldomero Lamy, RN Outcome: Progressing Goal: Cardiovascular complication will be avoided 01/27/2020 0751 by Baldomero Lamy, RN Outcome: Progressing 01/27/2020 0751 by Baldomero Lamy, RN Outcome: Progressing   Problem: Activity: Goal: Risk for activity intolerance will decrease 01/27/2020 0751 by Baldomero Lamy, RN Outcome: Progressing 01/27/2020 0751 by Baldomero Lamy, RN Outcome: Progressing   Problem: Nutrition: Goal: Adequate nutrition will be maintained 01/27/2020 0751 by Baldomero Lamy, RN Outcome: Progressing 01/27/2020 0751 by Baldomero Lamy, RN Outcome: Progressing   Problem: Coping: Goal: Level of anxiety will decrease 01/27/2020 0751 by Baldomero Lamy, RN Outcome: Progressing 01/27/2020 0751 by Baldomero Lamy,  RN Outcome: Progressing   Problem: Elimination: Goal: Will not experience complications related to bowel motility 01/27/2020 0751 by Baldomero Lamy, RN Outcome: Progressing 01/27/2020 0751 by Baldomero Lamy, RN Outcome: Progressing Goal: Will not experience complications related to urinary retention 01/27/2020 0751 by Baldomero Lamy, RN Outcome: Progressing 01/27/2020 0751 by Baldomero Lamy, RN Outcome: Progressing   Problem: Pain Managment: Goal: General experience of comfort will improve 01/27/2020 0751 by Baldomero Lamy, RN Outcome: Progressing 01/27/2020 0751 by Baldomero Lamy, RN Outcome: Progressing   Problem: Safety: Goal: Ability to remain free from injury will improve 01/27/2020 0751 by Baldomero Lamy, RN Outcome: Progressing 01/27/2020 0751 by Baldomero Lamy, RN Outcome: Progressing   Problem: Skin Integrity: Goal: Risk for impaired skin integrity will decrease 01/27/2020 0751 by Baldomero Lamy, RN Outcome: Progressing 01/27/2020 0751 by Baldomero Lamy, RN Outcome: Progressing

## 2020-01-27 NOTE — Discharge Instructions (Signed)
Follow with Rochel Brome, MD in 5-7 days  Please get a complete blood count and chemistry panel checked by your Primary MD at your next visit, and again as instructed by your Primary MD. Please get your medications reviewed and adjusted by your Primary MD.  Please request your Primary MD to go over all Hospital Tests and Procedure/Radiological results at the follow up, please get all Hospital records sent to your Prim MD by signing hospital release before you go home.  In some cases, there will be blood work, cultures and biopsy results pending at the time of your discharge. Please request that your primary care M.D. goes through all the records of your hospital data and follows up on these results.  If you had Pneumonia of Lung problems at the Hospital: Please get a 2 view Chest X ray done in 6-8 weeks after hospital discharge or sooner if instructed by your Primary MD.  If you have Congestive Heart Failure: Please call your Cardiologist or Primary MD anytime you have any of the following symptoms:  1) 3 pound weight gain in 24 hours or 5 pounds in 1 week  2) shortness of breath, with or without a dry hacking cough  3) swelling in the hands, feet or stomach  4) if you have to sleep on extra pillows at night in order to breathe  Follow cardiac low salt diet and 1.5 lit/day fluid restriction.  If you have diabetes Accuchecks 4 times/day, Once in AM empty stomach and then before each meal. Log in all results and show them to your primary doctor at your next visit. If any glucose reading is under 80 or above 300 call your primary MD immediately.  If you have Seizure/Convulsions/Epilepsy: Please do not drive, operate heavy machinery, participate in activities at heights or participate in high speed sports until you have seen by Primary MD or a Neurologist and advised to do so again. Per [facility] statutes, patients with seizures are not allowed to drive until they have been  seizure-free for six months.  Use caution when using heavy equipment or power tools. Avoid working on ladders or at heights. Take showers instead of baths. Ensure the water temperature is not too high on the home water heater. Do not go swimming alone. Do not lock yourself in a room alone (i.e. bathroom). When caring for infants or small children, sit down when holding, feeding, or changing them to minimize risk of injury to the child in the event you have a seizure. Maintain good sleep hygiene. Avoid alcohol.   If you had Gastrointestinal Bleeding: Please ask your Primary MD to check a complete blood count within one week of discharge or at your next visit. Your endoscopic/colonoscopic biopsies that are pending at the time of discharge, will also need to followed by your Primary MD.  Get Medicines reviewed and adjusted. Please take all your medications with you for your next visit with your Primary MD  Please request your Primary MD to go over all hospital tests and procedure/radiological results at the follow up, please ask your Primary MD to get all Hospital records sent to his/her office.  If you experience worsening of your admission symptoms, develop shortness of breath, life threatening emergency, suicidal or homicidal thoughts you must seek medical attention immediately by calling 911 or calling your MD immediately  if symptoms less severe.  You must read complete instructions/literature along with all the possible adverse reactions/side effects for all the Medicines you take  and that have been prescribed to you. Take any new Medicines after you have completely understood and accpet all the possible adverse reactions/side effects.   Do not drive or operate heavy machinery when taking Pain medications.   Do not take more than prescribed Pain, Sleep and Anxiety Medications  Special Instructions: If you have smoked or chewed Tobacco  in the last 2 yrs please stop smoking, stop any regular  Alcohol  and or any Recreational drug use.  Wear Seat belts while driving.  Please note You were cared for by a hospitalist during your hospital stay. If you have any questions about your discharge medications or the care you received while you were in the hospital after you are discharged, you can call the unit and asked to speak with the hospitalist on call if the hospitalist that took care of you is not available. Once you are discharged, your primary care physician will handle any further medical issues. Please note that NO REFILLS for any discharge medications will be authorized once you are discharged, as it is imperative that you return to your primary care physician (or establish a relationship with a primary care physician if you do not have one) for your aftercare needs so that they can reassess your need for medications and monitor your lab values.  You can reach the hospitalist office at phone [phone] or fax [phone]   If you do not have a primary care physician, you can call [phone] for a physician referral.  Activity: As tolerated with Full fall precautions use walker/cane & assistance as needed    Diet: regular  Disposition Home

## 2020-01-27 NOTE — Discharge Summary (Signed)
Physician Discharge Summary  Robert Arroyo [id number] DOB: 1960-12-12 DOA: 01/25/2020  PCP: Robert Brome, MD  Admit date: 01/25/2020 Discharge date: 01/27/2020  Admitted From: home Disposition:  home  Recommendations for Outpatient Follow-up:  1. Follow up with PCP in 1-2 weeks 2. Follow up with Dr. Alvan Dame in 2 weeks  Home Health: PT Equipment/Devices: walker  Discharge Condition: stable CODE STATUS: Full code Diet recommendation: regular  HPI: Per admitting MD, Robert Arroyo is a 59 y.o. male with medical history significant recent COVID-19 pneumonia who presents to the ED for evaluation of right hip pain after a fall.  Patient states he was taking down a deer ladder stand earlier today (01/25/2020) when the stand fell out of a tree while patient was still on it.  Height was approximately just under 20 feet.  The deer stand fell to the side and hit another tree on the way down.  Patient states he did also hit his head on the way down.  He landed directly on his right buttocks and had immediate severe pain.  He was unable to bear weight on his own.  He had diminished movement in his right hip but was able to move his right leg below the knee and all other extremities well.  He denied any loss of consciousness. Patient states the only medications he takes are metoprolol which was started years ago due to history of a heart murmur and cetirizine for seasonal allergies.  He denies any known history of hypertension or irregular heart rhythm.  He was previously admitted from 11/06/2019-11/08/2019 for COVID-19 pneumonia in which time he was treated with remdesivir and Decadron.  He has been doing well since that discharge.  Hospital Course / Discharge diagnoses: Principal Problem Multiple fractures of the right pelvis and L4/L5 transverse processes fall from height -happened after a deer ladder stand with patient on it fell approximately 20 feet. Orthopedic surgery consulted and recommended  nonoperative management with repeat imaging in 2 weeks in office.  Patient was admitted to the hospital for conservative management with pain control, he required several physical therapy sessions, gradually improving and will be discharged home in stable condition.  He initially required IV Dilaudid but today able to be comfortable just on oral agents.  Home health PT was arranged at the time of discharge.  He will follow up with Dr. Alvan Dame in office in 2 weeks for repeat imaging  Active Problems Acute kidney injury-creatinine has normalized with fluids Recent COVID-19 pneumonia-was admitted in March, status post treatment with remdesivir and Decadron  Discharge Instructions  Allergies as of 01/27/2020   No Known Allergies     Medication List    TAKE these medications   cetirizine 10 MG tablet Commonly known as: ZYRTEC Take 10 mg by mouth daily as needed for allergies.   ibuprofen 200 MG tablet Commonly known as: ADVIL Take 1-2 tablets (200-400 mg total) by mouth every 6 (six) hours as needed for moderate pain.   methocarbamol 500 MG tablet Commonly known as: ROBAXIN Take 1 tablet (500 mg total) by mouth every 8 (eight) hours as needed for muscle spasms.   metoprolol succinate 25 MG 24 hr tablet Commonly known as: TOPROL-XL Take 25 mg by mouth daily.   Oxycodone HCl 10 MG Tabs Take 1 tablet (10 mg total) by mouth every 6 (six) hours as needed for up to 5 days for moderate pain or breakthrough pain (Hold & Call MD if SBP<90, HR<65, RR<10, O2<90, or altered mental status.).  Follow-up Information    Paralee Cancel, MD Follow up in 2 week(s).   Specialty: Orthopedic Surgery Why: For radiographic follow up Contact information: [address] [place] [address] [place] [address] [id number]           Consultations:  Orthopedic surgery   Procedures/Studies:  CT HEAD WO CONTRAST  Result Date: 01/25/2020 CLINICAL DATA:  Fall from deer stand. EXAM: CT HEAD WITHOUT  CONTRAST CT CERVICAL SPINE WITHOUT CONTRAST TECHNIQUE: Multidetector CT imaging of the head and cervical spine was performed following the standard protocol without intravenous contrast. Multiplanar CT image reconstructions of the cervical spine were also generated. COMPARISON:  None. FINDINGS: CT HEAD FINDINGS Brain: No acute intracranial hemorrhage. No focal mass lesion. No CT evidence of acute infarction. No midline shift or mass effect. No hydrocephalus. Basilar cisterns are patent. Vascular: No hyperdense vessel or unexpected calcification. Skull: Normal. Negative for fracture or focal lesion. Sinuses/Orbits: Paranasal sinuses and mastoid air cells are clear. Orbits are clear. Other: None. CT CERVICAL SPINE FINDINGS Alignment: Normal alignment of the cervical vertebral bodies. Skull base and vertebrae: Normal craniocervical junction. No loss of vertebral body height or disc height. Normal facet articulation. No evidence of fracture. Soft tissues and spinal canal: No prevertebral soft tissue swelling. No perispinal or epidural hematoma. Disc levels: There is endplate spurring from [id number]. No subluxation. Upper chest: Clear Other: None IMPRESSION: 1. No intracranial trauma. 2. No cervical spine fracture. Electronically Signed   By: Suzy Bouchard M.D.   On: 01/25/2020 18:53   CT Angio Neck W and/or Wo Contrast  Result Date: 01/25/2020 CLINICAL DATA:  [place] from deer stand today.  Neck trauma blunt. EXAM: CT ANGIOGRAPHY NECK TECHNIQUE: Multidetector CT imaging of the neck was performed using the standard protocol during bolus administration of intravenous contrast. Multiplanar CT image reconstructions and MIPs were obtained to evaluate the vascular anatomy. Carotid stenosis measurements (when applicable) are obtained utilizing NASCET criteria, using the distal internal carotid diameter as the denominator. CONTRAST:  169mL OMNIPAQUE IOHEXOL 350 MG/ML SOLN COMPARISON:  None. FINDINGS: Aortic arch: Standard  branching. Imaged portion shows no evidence of aneurysm or dissection. No significant stenosis of the major arch vessel origins. Right carotid system: Normal right carotid. No atherosclerotic disease dissection or irregularity. Left carotid system: Normal left carotid. No atherosclerotic disease, dissection, or irregularity Vertebral arteries: Normal vertebral arteries bilaterally. No injury or stenosis. Skeleton: Cervical spondylosis most prominent at C4-5 and C5-6. Other neck: Negative for soft tissue mass or hematoma in the neck. Upper chest: Mild dependent atelectasis bilaterally. No effusion or pneumothorax. IMPRESSION: 1. Negative for carotid or vertebral artery injury in the neck. No significant stenosis. 2. Negative for soft tissue mass or hematoma in the neck. Electronically Signed   By: Franchot Gallo M.D.   On: 01/25/2020 19:10   CT CERVICAL SPINE WO CONTRAST  Result Date: 01/25/2020 CLINICAL DATA:  Fall from deer stand. EXAM: CT HEAD WITHOUT CONTRAST CT CERVICAL SPINE WITHOUT CONTRAST TECHNIQUE: Multidetector CT imaging of the head and cervical spine was performed following the standard protocol without intravenous contrast. Multiplanar CT image reconstructions of the cervical spine were also generated. COMPARISON:  None. FINDINGS: CT HEAD FINDINGS Brain: No acute intracranial hemorrhage. No focal mass lesion. No CT evidence of acute infarction. No midline shift or mass effect. No hydrocephalus. Basilar cisterns are patent. Vascular: No hyperdense vessel or unexpected calcification. Skull: Normal. Negative for fracture or focal lesion. Sinuses/Orbits: Paranasal sinuses and mastoid air cells are clear. Orbits are clear. Other:  None. CT CERVICAL SPINE FINDINGS Alignment: Normal alignment of the cervical vertebral bodies. Skull base and vertebrae: Normal craniocervical junction. No loss of vertebral body height or disc height. Normal facet articulation. No evidence of fracture. Soft tissues and spinal  canal: No prevertebral soft tissue swelling. No perispinal or epidural hematoma. Disc levels: There is endplate spurring from [id number]. No subluxation. Upper chest: Clear Other: None IMPRESSION: 1. No intracranial trauma. 2. No cervical spine fracture. Electronically Signed   By: Suzy Bouchard M.D.   On: 01/25/2020 18:53   CT ABDOMEN PELVIS W CONTRAST  Result Date: 01/25/2020 CLINICAL DATA:  59 year old male with abdominal trauma. EXAM: CT ABDOMEN AND PELVIS WITH CONTRAST TECHNIQUE: Multidetector CT imaging of the abdomen and pelvis was performed using the standard protocol following bolus administration of intravenous contrast. CONTRAST:  171mL OMNIPAQUE IOHEXOL 350 MG/ML SOLN COMPARISON:  None. FINDINGS: Lower chest: Bibasilar atelectasis. No intra-abdominal free air. Small amount of blood within the pelvis anteriorly. Hepatobiliary: No focal liver abnormality is seen. No gallstones, gallbladder wall thickening, or biliary dilatation. Pancreas: Unremarkable. No pancreatic ductal dilatation or surrounding inflammatory changes. Spleen: Normal in size without focal abnormality. Adrenals/Urinary Tract: The adrenal glands are unremarkable. The kidneys, visualized ureters, and urinary bladder appear unremarkable. Stomach/Bowel: There is no bowel obstruction or active inflammation. The appendix is normal. Vascular/Lymphatic: The abdominal aorta and IVC are unremarkable. No portal venous gas. There is no adenopathy. Reproductive: The prostate and seminal vesicles are grossly unremarkable. No pelvic mass. Other: There is a small right inguinal hernia with extension of small amount of blood within the right inguinal canal. Musculoskeletal: Comminuted mildly displaced fracture of the right superior pubic ramus and right inferior pubic ramus with extension of the fracture into the symphysis pubis. There is a mildly displaced fracture of the posterior right pelvic bone superior to the ischial tuberosity. There is  nondisplaced fracture of the superomedial right acetabular wall. There is a nondisplaced longitudinal fracture of the right sacral ala. Fractures of the right L4 and L5 transverse processes. 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 Degenerative changes of the spine. IMPRESSION: 1. Right pelvic bone fractures as described above with a small pelvic hematoma. 2. Fractures of the right L4 and L5 transverse processes. 3. No solid organ or hollow viscus injury. Electronically Signed   By: Anner Crete M.D.   On: 01/25/2020 18:57   DG Pelvis Portable  Result Date: 01/25/2020 CLINICAL DATA:  Patient fell 20 feet.  Right hip pain. EXAM: PORTABLE PELVIS 1-2 VIEWS COMPARISON:  None. FINDINGS: Proximal right femur is grossly normal although evaluation is limited due to internal rotation. Visualized lower lumbar spine and sacrum are normal. Iliac crests are normal. Apparent cortical break along the medial aspect of the right pubic ramus. No other acute abnormalities. IMPRESSION: Suggested cortical break through the medial right inferior pubic ramus suggesting a fracture. No other fractures are seen. Electronically Signed   By: Dorise Bullion III M.D   On: 01/25/2020 18:29   DG Chest Port 1 View  Result Date: 01/25/2020 CLINICAL DATA:  Patient fell 20 feet and now has right hip pain EXAM: PORTABLE CHEST 1 VIEW COMPARISON:  Chest radiograph 11/06/2019 FINDINGS: The heart size and mediastinal contours are within normal limits. Low lung volumes but otherwise clear. No pneumothorax or significant pleural effusion. No acute finding in the visualized skeleton. IMPRESSION: No acute cardiopulmonary finding. Electronically Signed   By: Audie Pinto M.D.   On: 01/25/2020 18:24     Subjective: - no  chest pain, shortness of breath, no abdominal pain, nausea or vomiting.   Discharge Exam: BP 131/79 (BP Location: Left Arm)   Pulse 98   Temp 100.1 F (37.8 C) (Oral)   Resp 18   Ht 5\' 7"  (1.702 m)   Wt 77.1 kg   SpO2  95%   BMI 26.63 kg/m   General: Pt is alert, awake, not in acute distress Cardiovascular: RRR, S1/S2 +, no rubs, no gallops Respiratory: CTA bilaterally, no wheezing, no rhonchi Abdominal: Soft, NT, ND, bowel sounds + Extremities: no edema, no cyanosis  The results of significant diagnostics from this hospitalization (including imaging, microbiology, ancillary and laboratory) are listed below for reference.     Microbiology: Recent Results (from the past 240 hour(s))  SARS Coronavirus 2 by RT PCR (hospital order, performed in [facility] hospital lab) Nasopharyngeal Nasopharyngeal Swab     Status: None   Collection Time: 01/25/20 11:00 PM   Specimen: Nasopharyngeal Swab  Result Value Ref Range Status   SARS Coronavirus 2 NEGATIVE NEGATIVE Final    Comment: (NOTE) SARS-CoV-2 target nucleic acids are NOT DETECTED. The SARS-CoV-2 RNA is generally detectable in upper and lower respiratory specimens during the acute phase of infection. The lowest concentration of SARS-CoV-2 viral copies this assay can detect is 250 copies / mL. A negative result does not preclude SARS-CoV-2 infection and should not be used as the sole basis for treatment or other patient management decisions.  A negative result may occur with improper specimen collection / handling, submission of specimen other than nasopharyngeal swab, presence of viral mutation(s) within the areas targeted by this assay, and inadequate number of viral copies (<250 copies / mL). A negative result must be combined with clinical observations, patient history, and epidemiological information. Fact Sheet for Patients:   StrictlyIdeas.no Fact Sheet for Healthcare Providers: BankingDealers.co.za This test is not yet approved or cleared  by the [place] FDA and has been authorized for detection and/or diagnosis of SARS-CoV-2 by FDA under an Emergency Use Authorization (EUA).  This EUA  will remain in effect (meaning this test can be used) for the duration of the COVID-19 declaration under Section 564(b)(1) of the Act, 21 U.S.C. section 360bbb-3(b)(1), unless the authorization is terminated or revoked sooner. Performed at [facility], [place] [address], [place], [place] [address]      Labs: Basic Metabolic Panel: Recent Labs  Lab 01/25/20 1803 01/25/20 1811 01/26/20 0334  NA 138 140 137  K 4.4 3.8 4.4  CL 104 106 106  CO2 24  --  24  GLUCOSE 159* 156* 142*  BUN 15 18 15   CREATININE 1.33* 1.40* 1.17  CALCIUM 9.0  --  8.5*   Liver Function Tests: Recent Labs  Lab 01/25/20 1803  AST 45*  ALT 35  ALKPHOS 73  BILITOT 1.1  PROT 6.5  ALBUMIN 3.9   CBC: Recent Labs  Lab 01/25/20 1803 01/25/20 1811 01/26/20 0334  WBC 18.3*  --  10.2  HGB 15.1 14.6 13.5  HCT 45.5 43.0 40.3  MCV 89.7  --  91.0  PLT 228  --  174   CBG: No results for input(s): GLUCAP in the last 168 hours. Hgb A1c No results for input(s): HGBA1C in the last 72 hours. Lipid Profile No results for input(s): CHOL, HDL, LDLCALC, TRIG, CHOLHDL, LDLDIRECT in the last 72 hours. Thyroid function studies No results for input(s): TSH, T4TOTAL, T3FREE, THYROIDAB in the last 72 hours.  Invalid input(s): FREET3 Urinalysis No  results found for: COLORURINE, APPEARANCEUR, LABSPEC, PHURINE, GLUCOSEU, HGBUR, BILIRUBINUR, KETONESUR, PROTEINUR, UROBILINOGEN, NITRITE, LEUKOCYTESUR  FURTHER DISCHARGE INSTRUCTIONS:   Get Medicines reviewed and adjusted: Please take all your medications with you for your next visit with your Primary MD   Laboratory/radiological data: Please request your Primary MD to go over all hospital tests and procedure/radiological results at the follow up, please ask your Primary MD to get all Hospital records sent to his/her office.   In some cases, they will be blood work, cultures and biopsy results pending at the time of your discharge. Please request that your  primary care M.D. goes through all the records of your hospital data and follows up on these results.   Also Note the following: If you experience worsening of your admission symptoms, develop shortness of breath, life threatening emergency, suicidal or homicidal thoughts you must seek medical attention immediately by calling 911 or calling your MD immediately  if symptoms less severe.   You must read complete instructions/literature along with all the possible adverse reactions/side effects for all the Medicines you take and that have been prescribed to you. Take any new Medicines after you have completely understood and accpet all the possible adverse reactions/side effects.    Do not drive when taking Pain medications or sleeping medications (Benzodaizepines)   Do not take more than prescribed Pain, Sleep and Anxiety Medications. It is not advisable to combine anxiety,sleep and pain medications without talking with your primary care practitioner   Special Instructions: If you have smoked or chewed Tobacco  in the last 2 yrs please stop smoking, stop any regular Alcohol  and or any Recreational drug use.   Wear Seat belts while driving.   Please note: You were cared for by a hospitalist during your hospital stay. Once you are discharged, your primary care physician will handle any further medical issues. Please note that NO REFILLS for any discharge medications will be authorized once you are discharged, as it is imperative that you return to your primary care physician (or establish a relationship with a primary care physician if you do not have one) for your post hospital discharge needs so that they can reassess your need for medications and monitor your lab values.  Time coordinating discharge: 35 minutes  SIGNED:  Marzetta Board, MD, PhD 01/27/2020, 2:16 PM

## 2020-01-27 NOTE — Progress Notes (Signed)
Physical Therapy Treatment Patient Details Name: Robert Arroyo MRN: [id number] DOB: [birth year] Today's Date: 01/27/2020    History of Present Illness Robert Arroyo is a 59 y/o male who presents to the ED s/p fall from deer stand and R hip pain. He was found to have a R inferior pubic ramus fracture and is WBAT. PMH siginifant for admission 3/11-3/13 for COVID PNA but has been doing well since, a-fib, hyperlipidemia, right inguinal hernia, and cardiac catheterization, and COVID-19 pneumonia.     PT Comments    Pt progressing towards physical therapy goals. Pt continues to be limited by pain and reports that he does not feel the pain meds are providing adequate pain control. Encouraged pt to discuss with RN/MD. Overall, pt appears improved today, and although he is requiring significant increased time to complete mobility tasks, he is able to do so grossly with up to min assist and RW for support. Pt anticipates d/c home today. He and wife were educated on car transfer, positioning recommendations, and stair training. Will continue to follow and progress as able per POC.     Follow Up Recommendations  Home health PT;Supervision for mobility/OOB     Equipment Recommendations  None recommended by PT    Recommendations for Other Services       Precautions / Restrictions Precautions Precautions: Fall Restrictions Weight Bearing Restrictions: Yes RLE Weight Bearing: Weight bearing as tolerated LLE Weight Bearing: Weight bearing as tolerated    Mobility  Bed Mobility Overal bed mobility: Needs Assistance Bed Mobility: Supine to Sit     Supine to sit: Min assist;HOB elevated     General bed mobility comments: Increased time. Pt was able to initiate transfer to EOB however required therapist assist to complete.   Transfers Overall transfer level: Needs assistance Equipment used: Rolling walker (2 wheeled) Transfers: Sit to/from [facility] Sit to Stand: Min  assist;+2 safety/equipment         General transfer comment: Initially without assistance and pt was able to complete sit>stand with min guard. By end of session pt requiring some boost assist - likely due to increase in pain and fatigue.   Ambulation/Gait Ambulation/Gait assistance: Min guard Gait Distance (Feet): 20 Feet Assistive device: Rolling walker (2 wheeled) Gait Pattern/deviations: Decreased stride length;Step-to pattern;Trunk flexed;Narrow base of support;Decreased weight shift to right Gait velocity: Decreased Gait velocity interpretation: <1.31 ft/sec, indicative of household ambulator General Gait Details: Pt having difficulty clearing floor with R foot and was sliding it along the floor to advance. Increased time required with signficiantly decreased step/stride length.    Stairs Stairs: Yes Stairs assistance: Min assist;+2 physical assistance;+2 safety/equipment Stair Management: One rail Left;With cane;Step to pattern;Forwards;Backwards Number of Stairs: 2 General stair comments: Pt with L railing and use of SPC on the R to simulate home environment. Pt was able to complete a total of 2 stairs however with significant pain, especialy on descent. Pt and wife were educated on sequencing and safety.    Wheelchair Mobility    Modified Rankin (Stroke Patients Only)       Balance Overall balance assessment: Needs assistance Sitting-balance support: No upper extremity supported;Feet supported Sitting balance-Leahy Scale: Fair Sitting balance - Comments: able to sit without UE support, demonstrates preference for pain management   Standing balance support: Bilateral upper extremity supported;During functional activity Standing balance-Leahy Scale: Poor Standing balance comment: reliant on BUE support On RW  Cognition Arousal/Alertness: Awake/alert Behavior During Therapy: WFL for tasks assessed/performed Overall Cognitive  Status: Within Functional Limits for tasks assessed                                        Exercises      General Comments        Pertinent Vitals/Pain Pain Assessment: Faces Faces Pain Scale: Hurts whole lot(during mobility) Pain Location: R hip;faces indicate increased pain with movement Pain Descriptors / Indicators: Grimacing;Guarding Pain Intervention(s): Limited activity within patient's tolerance;Monitored during session;Repositioned    Home Living                      Prior Function            PT Goals (current goals can now be found in the care plan section) Acute Rehab PT Goals Patient Stated Goal: to go home soon;manage pain PT Goal Formulation: With patient/family Time For Goal Achievement: 02/02/20 Potential to Achieve Goals: Good Progress towards PT goals: Progressing toward goals    Frequency    Min 5X/week      PT Plan Current plan remains appropriate    Co-evaluation PT/OT/SLP Co-Evaluation/Treatment: Yes Reason for Co-Treatment: To address functional/ADL transfers;For patient/therapist safety PT goals addressed during session: Mobility/safety with mobility;Balance;Proper use of DME        AM-PAC PT "6 Clicks" Mobility   Outcome Measure  Help needed turning from your back to your side while in a flat bed without using bedrails?: A Little Help needed moving from lying on your back to sitting on the side of a flat bed without using bedrails?: A Little Help needed moving to and from a bed to a chair (including a wheelchair)?: A Little Help needed standing up from a chair using your arms (e.g., wheelchair or bedside chair)?: A Little Help needed to walk in hospital room?: A Little Help needed climbing 3-5 steps with a railing? : A Lot 6 Click Score: 17    End of Session Equipment Utilized During Treatment: Gait belt Activity Tolerance: Patient tolerated treatment well Patient left: in chair;with call bell/phone  within reach;with family/visitor present Nurse Communication: Mobility status PT Visit Diagnosis: Unsteadiness on feet (R26.81);Pain;Other abnormalities of gait and mobility (R26.89) Pain - Right/Left: Right Pain - part of body: Hip     Time: [id number] PT Time Calculation (min) (ACUTE ONLY): 44 min  Charges:  $Gait Training: 23-37 mins                     Rolinda Roan, PT, DPT Acute Rehabilitation Services Pager: [phone] Office: [phone]    Thelma Comp 01/27/2020, 12:00 PM

## 2020-01-27 NOTE — TOC Initial Note (Addendum)
Transition of Care [facility]) - Initial/Assessment Note    Patient Details  Name: Robert Arroyo MRN: [id number] Date of Birth: 1960/12/17  Transition of Care [facility]) CM/SW Contact:    Curlene Labrum, RN Phone Number: 01/27/2020, 4:28 PM  Clinical Narrative:                  Case management met with the patient and wife at the bedside today to discuss discharge needs.  Patient was given choice regarding home health agencies and patient and wife have no preference for home care companies.  I called [facility], [facility], [facility], [facility], [facility], [facility], [facility], [facility], [facility], and [place] was unable to provide services this week.  I called [facility] back and I'm waiting to see if patient could have services starting next week since the wife was not comfortable with outpatient PT services.  Will continue to follow for home health possibilities.    6/1 1656 - Called Adrian Prince, PA with Dr. Alvan Dame will be scheduling the patient for outpatient physical therapy since I was unable to establish a home health agency to provide PT services.  The family is aware.   Expected Discharge Plan: [place] Barriers to Discharge: No Barriers Identified   Patient Goals and CMS Choice Patient states their goals for this hospitalization and ongoing recovery are:: Patient states that he wants to get better and go home. CMS Medicare.gov Compare Post Acute Care list provided to:: Patient Choice offered to / list presented to : Patient  Expected Discharge Plan and Services Expected Discharge Plan: [place]   Discharge Planning Services: CM Consult Post Acute Care Choice: Durable Medical Equipment, Home Health Living arrangements for the past 2 months: Single Family Home Expected Discharge Date: 01/27/20               DME Arranged: N/A         HH Arranged: PT          Prior Living  Arrangements/Services Living arrangements for the past 2 months: Single Family Home Lives with:: Spouse Patient language and need for interpreter reviewed:: Yes Do you feel safe going back to the place where you live?: Yes      Need for Family Participation in Patient Care: Yes (Comment) Care giver support system in place?: Yes (comment) Current home services: DME(patient currently has RW, Cane, RW with seat,  toilet lift and shower seat) Criminal Activity/Legal Involvement Pertinent to Current Situation/Hospitalization: No - Comment as needed  Activities of Daily Living Home Assistive Devices/Equipment: None ADL Screening (condition at time of admission) Patient's cognitive ability adequate to safely complete daily activities?: Yes Is the patient deaf or have difficulty hearing?: No Does the patient have difficulty seeing, even when wearing glasses/contacts?: No Does the patient have difficulty concentrating, remembering, or making decisions?: No Patient able to express need for assistance with ADLs?: Yes Does the patient have difficulty dressing or bathing?: No Independently performs ADLs?: Yes (appropriate for developmental age) Does the patient have difficulty walking or climbing stairs?: No Weakness of Legs: None Weakness of Arms/Hands: None  Permission Sought/Granted Permission sought to share information with : Case Manager Permission granted to share information with : Yes, Verbal Permission Granted     Permission granted to share info w AGENCY: home health agencies  Permission granted to share info w Relationship: wife     Emotional Assessment Appearance:: Appears  stated age Attitude/Demeanor/Rapport: Gracious Affect (typically observed): Accepting Orientation: : Oriented to Self, Oriented to Place, Oriented to  Time, Oriented to Situation Alcohol / Substance Use: Not Applicable Psych Involvement: No (comment)  Admission diagnosis:  Fall [W19.XXXA] Closed fracture of  multiple pubic rami, right, initial encounter ([place]) [S32.591A] Multiple closed fractures of pelvis with stable disruption of pelvic ring, initial encounter ([place]) [S32.810A] Pelvic fracture ([place]) [S32.9XXA] Patient Active Problem List   Diagnosis Date Noted  . Pelvic fracture ([place]) 01/26/2020  . Closed fracture of multiple pubic rami, right, initial encounter ([place]) 01/25/2020  . AKI (acute kidney injury) ([place])   . Pneumonia due to COVID-19 virus 11/06/2019  . Hypoxia    PCP:  Rochel Brome, MD Pharmacy:   [id number] [place]: [phone]: [phone] [facility] [place]Phone: [phone]: [phone]    Social Determinants of Health (SDOH) Interventions    Readmission Risk Interventions No flowsheet data found.

## 2020-01-27 NOTE — Progress Notes (Signed)
Occupational Therapy Treatment Patient Details Name: Robert Arroyo MRN: [id number] DOB: 04/18/1961 Today's Date: 01/27/2020    History of present illness Robert Arroyo is a 59 y/o male who presents to the ED s/p fall from deer stand and R hip pain. He was found to have a R inferior pubic ramus fracture and is WBAT. PMH siginifant for admission 3/11-3/13 for COVID PNA but has been doing well since, a-fib, hyperlipidemia, right inguinal hernia, and cardiac catheterization, and COVID-19 pneumonia.    OT comments  Pt continues to be limited by pain, he reports he does not feel that the pain medication is working for him. Advised pt to discuss this with RN/MD. Pt progressing toward established OT goals. He required minA+2 for functional mobility at RW level. Educated pt and wife on safe car transfers. Pt will continue to benefit from skilled OT services to maximize safety and independence with ADL/IADL and functional mobility. Will continue to follow acutely and progress as tolerated.    Follow Up Recommendations  No OT follow up    Equipment Recommendations  3 in 1 bedside commode    Recommendations for Other Services      Precautions / Restrictions Precautions Precautions: Fall Restrictions Weight Bearing Restrictions: Yes RLE Weight Bearing: Weight bearing as tolerated LLE Weight Bearing: Weight bearing as tolerated       Mobility Bed Mobility Overal bed mobility: Needs Assistance Bed Mobility: Supine to Sit     Supine to sit: Min assist;HOB elevated     General bed mobility comments: Increased time. Pt was able to initiate transfer to EOB however required therapist assist to complete.   Transfers Overall transfer level: Needs assistance Equipment used: Rolling walker (2 wheeled) Transfers: Sit to/from [facility] Sit to Stand: Min assist;+2 safety/equipment         General transfer comment: Initially without assistance and pt was able to complete  sit>stand with min guard. By end of session pt requiring some boost assist - likely due to increase in pain and fatigue.     Balance Overall balance assessment: Needs assistance Sitting-balance support: No upper extremity supported;Feet supported Sitting balance-Leahy Scale: Fair Sitting balance - Comments: able to sit without UE support, demonstrates preference for pain management   Standing balance support: Bilateral upper extremity supported;During functional activity Standing balance-Leahy Scale: Poor Standing balance comment: reliant on BUE support On RW                           ADL either performed or assessed with clinical judgement   ADL Overall ADL's : Needs assistance/impaired                     Lower Body Dressing: Moderate assistance;Sitting/lateral leans   Toilet Transfer: Minimal assistance;+2 for physical assistance;+2 for safety/equipment;Stand-pivot   Toileting- Clothing Manipulation and Hygiene: Minimal assistance;+2 for physical assistance;+2 for safety/equipment Toileting - Clothing Manipulation Details (indicate cue type and reason): minA+2 to progress into standing     Functional mobility during ADLs: Minimal assistance;+2 for physical assistance;+2 for safety/equipment General ADL Comments: pt limited by pain requires increased time and effort for mobility progression     Vision       Perception     Praxis      Cognition Arousal/Alertness: Awake/alert Behavior During Therapy: WFL for tasks assessed/performed Overall Cognitive Status: Within Functional Limits for tasks assessed  Exercises     Shoulder Instructions       General Comments      Pertinent Vitals/ Pain       Pain Assessment: Faces Faces Pain Scale: Hurts whole lot(during mobility) Pain Location: R hip;faces indicate increased pain with movement Pain Descriptors / Indicators: Grimacing;Guarding Pain  Intervention(s): Limited activity within patient's tolerance;Monitored during session  Home Living                                          Prior Functioning/Environment              Frequency  Min 2X/week        Progress Toward Goals  OT Goals(current goals can now be found in the care plan section)  Progress towards OT goals: Progressing toward goals  Acute Rehab OT Goals Patient Stated Goal: to go home soon;manage pain OT Goal Formulation: With patient Time For Goal Achievement: 02/09/20 Potential to Achieve Goals: Good ADL Goals Pt Will Perform Grooming: with supervision;standing Pt Will Perform Lower Body Dressing: with min assist;with adaptive equipment;sit to/from stand Pt Will Transfer to Toilet: with min guard assist;ambulating Pt Will Perform Toileting - Clothing Manipulation and hygiene: with supervision;sit to/from stand Pt Will Perform Tub/Shower Transfer: with supervision;Shower transfer  Plan Discharge plan remains appropriate    Co-evaluation    PT/OT/SLP Co-Evaluation/Treatment: Yes Reason for Co-Treatment: For patient/therapist safety;To address functional/ADL transfers PT goals addressed during session: Mobility/safety with mobility;Balance;Proper use of DME OT goals addressed during session: ADL's and self-care      AM-PAC OT "6 Clicks" Daily Activity     Outcome Measure   Help from another person eating meals?: A Little Help from another person taking care of personal grooming?: A Little Help from another person toileting, which includes using toliet, bedpan, or urinal?: A Lot Help from another person bathing (including washing, rinsing, drying)?: A Little Help from another person to put on and taking off regular upper body clothing?: A Little Help from another person to put on and taking off regular lower body clothing?: A Lot 6 Click Score: 16    End of Session Equipment Utilized During Treatment: Gait belt;Rolling  walker  OT Visit Diagnosis: Unsteadiness on feet (R26.81);Other abnormalities of gait and mobility (R26.89);History of falling (Z91.81);Pain Pain - Right/Left: Right Pain - part of body: Hip   Activity Tolerance Patient tolerated treatment well   Patient Left in chair;with call bell/phone within reach;with family/visitor present   Nurse Communication Mobility status        Time: [id number] OT Time Calculation (min): 43 min  Charges: OT General Charges $OT Visit: 1 Visit OT Treatments $Self Care/Home Management : 8-22 mins  Helene Kelp OTR/L Acute Rehabilitation Services Office: [phone]    Wyn Forster 01/27/2020, 12:42 PM

## 2020-01-27 NOTE — Plan of Care (Signed)

## 2020-04-14 ENCOUNTER — Ambulatory Visit (INDEPENDENT_AMBULATORY_CARE_PROVIDER_SITE_OTHER): Payer: Commercial Managed Care - PPO | Admitting: Family Medicine

## 2020-04-14 ENCOUNTER — Encounter: Payer: Self-pay | Admitting: Family Medicine

## 2020-04-14 ENCOUNTER — Other Ambulatory Visit: Payer: Self-pay

## 2020-04-14 VITALS — BP 124/80 | HR 62 | Temp 97.6°F | Ht 67.0 in | Wt 179.0 lb

## 2020-04-14 DIAGNOSIS — Z125 Encounter for screening for malignant neoplasm of prostate: Secondary | ICD-10-CM

## 2020-04-14 DIAGNOSIS — Z0000 Encounter for general adult medical examination without abnormal findings: Secondary | ICD-10-CM

## 2020-04-14 DIAGNOSIS — Z23 Encounter for immunization: Secondary | ICD-10-CM | POA: Diagnosis not present

## 2020-04-14 NOTE — Patient Instructions (Signed)
Preventive Care 41-60 Years Old, Male Preventive care refers to lifestyle choices and visits with your health care provider that can promote health and wellness. This includes:  A yearly physical exam. This is also called an annual well check.  Regular dental and eye exams.  Immunizations.  Screening for certain conditions.  Healthy lifestyle choices, such as eating a healthy diet, getting regular exercise, not using drugs or products that contain nicotine and tobacco, and limiting alcohol use. What can I expect for my preventive care visit? Physical exam Your health care provider will check:  Height and weight. These may be used to calculate body mass index (BMI), which is a measurement that tells if you are at a healthy weight.  Heart rate and blood pressure.  Your skin for abnormal spots. Counseling Your health care provider may ask you questions about:  Alcohol, tobacco, and drug use.  Emotional well-being.  Home and relationship well-being.  Sexual activity.  Eating habits.  Work and work Statistician. What immunizations do I need?  Influenza (flu) vaccine  This is recommended every year. Tetanus, diphtheria, and pertussis (Tdap) vaccine  You may need a Td booster every 10 years. Varicella (chickenpox) vaccine  You may need this vaccine if you have not already been vaccinated. Zoster (shingles) vaccine  You may need this after age 64. Measles, mumps, and rubella (MMR) vaccine  You may need at least one dose of MMR if you were born in 1957 or later. You may also need a second dose. Pneumococcal conjugate (PCV13) vaccine  You may need this if you have certain conditions and were not previously vaccinated. Pneumococcal polysaccharide (PPSV23) vaccine  You may need one or two doses if you smoke cigarettes or if you have certain conditions. Meningococcal conjugate (MenACWY) vaccine  You may need this if you have certain conditions. Hepatitis A  vaccine  You may need this if you have certain conditions or if you travel or work in places where you may be exposed to hepatitis A. Hepatitis B vaccine  You may need this if you have certain conditions or if you travel or work in places where you may be exposed to hepatitis B. Haemophilus influenzae type b (Hib) vaccine  You may need this if you have certain risk factors. Human papillomavirus (HPV) vaccine  If recommended by your health care provider, you may need three doses over 6 months. You may receive vaccines as individual doses or as more than one vaccine together in one shot (combination vaccines). Talk with your health care provider about the risks and benefits of combination vaccines. What tests do I need? Blood tests  Lipid and cholesterol levels. These may be checked every 5 years, or more frequently if you are over 60 years old.  Hepatitis C test.  Hepatitis B test. Screening  Lung cancer screening. You may have this screening every year starting at age 43 if you have a 30-pack-year history of smoking and currently smoke or have quit within the past 15 years.  Prostate cancer screening. Recommendations will vary depending on your family history and other risks.  Colorectal cancer screening. All adults should have this screening starting at age 72 and continuing until age 2. Your health care provider may recommend screening at age 14 if you are at increased risk. You will have tests every 1-10 years, depending on your results and the type of screening test.  Diabetes screening. This is done by checking your blood sugar (glucose) after you have not eaten  for a while (fasting). You may have this done every 1-3 years.  Sexually transmitted disease (STD) testing. Follow these instructions at home: Eating and drinking  Eat a diet that includes fresh fruits and vegetables, whole grains, lean protein, and low-fat dairy products.  Take vitamin and mineral supplements as  recommended by your health care provider.  Do not drink alcohol if your health care provider tells you not to drink.  If you drink alcohol: ? Limit how much you have to 0-2 drinks a day. ? Be aware of how much alcohol is in your drink. In the [place] one drink equals one 12 oz bottle of beer (355 mL), one 5 oz glass of wine (148 mL), or one 1 oz glass of hard liquor (44 mL). Lifestyle  Take daily care of your teeth and gums.  Stay active. Exercise for at least 30 minutes on 5 or more days each week.  Do not use any products that contain nicotine or tobacco, such as cigarettes, e-cigarettes, and chewing tobacco. If you need help quitting, ask your health care provider.  If you are sexually active, practice safe sex. Use a condom or other form of protection to prevent STIs (sexually transmitted infections).  Talk with your health care provider about taking a low-dose aspirin every day starting at age 53. What's next?  Go to your health care provider once a year for a well check visit.  Ask your health care provider how often you should have your eyes and teeth checked.  Stay up to date on all vaccines. This information is not intended to replace advice given to you by your health care provider. Make sure you discuss any questions you have with your health care provider. Document Revised: 08/08/2018 Document Reviewed: 08/08/2018 Elsevier Patient Education  2020 [employer].

## 2020-04-14 NOTE — Progress Notes (Signed)
Subjective:  Patient ID: Robert Arroyo, male    DOB: 01/19/1961  Age: 59 y.o. MRN: [id number]  Chief Complaint  Patient presents with  . Annual Exam    HPI  Well Adult Physical: Patient here for a comprehensive physical exam. Do you take any herbs or supplements that were not prescribed by a doctor? No Are you taking calcium supplements?No Are you taking aspirin daily? sometimes  Encounter for general adult medical examination without abnormal findings  Physical ("At Risk" items are starred): Patient's last physical exam was 1 year ago .  Weight: Appropriate for height (BMI 28%) ;  Blood Pressure: Normal (BP less than 120/80) ;  Medical History: Patient history reviewed ; Family history reviewed ;  Allergies Reviewed: No change in current allergies ;  Medications Reviewed: Medications reviewed - no changes ;  Lipids: Normal lipid levels ;  Smoking: Life-long non-smoker ;  Physical Activity: Exercises at least 3 times per week ;  Alcohol/Drug Use: Is a non-drinker ; No illicit drug use ;  Patient is not afflicted from Stress Incontinence and Urge Incontinence  Safety: reviewed ; Patient wears a seat belt, has smoke detectors, has carbon monoxide detectors, practices appropriate gun safety, and wears sunscreen with extended sun exposure. Dental Care: biannual cleanings, brushes and flosses daily. Ophthalmology/Optometry: Annual visit.  Hearing loss: none Vision impairments: wears glasses next Eye appt 04/30/2020 Last PSA:  May 2021: Fracture pelvis and two lumbar vertebrae. No surgery required. No pain now. 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   Covid 19 pneumonia: admitted for 3 days at [facility]. IV Remdesmevir.        Office Visit from 04/14/2020 in [facility]  PHQ-2 Total Score 0              Social History   Socioeconomic History  . Marital status: Married    Spouse name: Not on file  . Number of children: 0  . Years of education:  Not on file  . Highest education level: Not on file  Occupational History  . Not on file  Tobacco Use  . Smoking status: Never Smoker  . Smokeless tobacco: Never Used  Vaping Use  . Vaping Use: Never used  Substance and Sexual Activity  . Alcohol use: Yes    Comment: red wine rarely  . Drug use: Never  . Sexual activity: Not on file  Other Topics Concern  . Not on file  Social History Narrative  . Not on file   Social Determinants of Health   Financial Resource Strain:   . Difficulty of Paying Living Expenses: Not on file  Food Insecurity:   . Worried About Charity fundraiser in the Last Year: Not on file  . Ran Out of Food in the Last Year: Not on file  Transportation Needs:   . Lack of Transportation (Medical): Not on file  . Lack of Transportation (Non-Medical): Not on file  Physical Activity:   . Days of Exercise per Week: Not on file  . Minutes of Exercise per Session: Not on file  Stress:   . Feeling of Stress : Not on file  Social Connections:   . Frequency of Communication with Friends and Family: Not on file  . Frequency of Social Gatherings with Friends and Family: Not on file  . Attends Religious Services: Not on file  . Active Member of Clubs or Organizations: Not on file  . Attends Archivist Meetings: Not  on file  . Marital Status: Not on file   Past Medical History:  Diagnosis Date  . A-fib ([place]) 2003  . Closed fracture of multiple pubic rami ([place]) 01/2020  . Hyperlipidemia   . Right inguinal hernia    Past Surgical History:  Procedure Laterality Date  . CARDIAC CATHETERIZATION  2003    Family History  Problem Relation Age of Onset  . Leukemia Mother   . Glaucoma Father    Social History   Socioeconomic History  . Marital status: Married    Spouse name: Not on file  . Number of children: 0  . Years of education: Not on file  . Highest education level: Not on file  Occupational History  . Not on file  Tobacco Use  . Smoking  status: Never Smoker  . Smokeless tobacco: Never Used  Vaping Use  . Vaping Use: Never used  Substance and Sexual Activity  . Alcohol use: Yes    Comment: red wine rarely  . Drug use: Never  . Sexual activity: Not on file  Other Topics Concern  . Not on file  Social History Narrative  . Not on file   Social Determinants of Health   Financial Resource Strain:   . Difficulty of Paying Living Expenses: Not on file  Food Insecurity:   . Worried About Charity fundraiser in the Last Year: Not on file  . Ran Out of Food in the Last Year: Not on file  Transportation Needs:   . Lack of Transportation (Medical): Not on file  . Lack of Transportation (Non-Medical): Not on file  Physical Activity:   . Days of Exercise per Week: Not on file  . Minutes of Exercise per Session: Not on file  Stress:   . Feeling of Stress : Not on file  Social Connections:   . Frequency of Communication with Friends and Family: Not on file  . Frequency of Social Gatherings with Friends and Family: Not on file  . Attends Religious Services: Not on file  . Active Member of Clubs or Organizations: Not on file  . Attends Archivist Meetings: Not on file  . Marital Status: Not on file   Review of Systems  Constitutional: Negative for chills, diaphoresis, fatigue and fever.  HENT: Negative for congestion, ear pain and sore throat.   Respiratory: Negative for cough and shortness of breath.   Cardiovascular: Negative for chest pain and leg swelling.  Gastrointestinal: Negative for abdominal pain, constipation, diarrhea, nausea and vomiting.  Genitourinary: Negative for dysuria and urgency.  Musculoskeletal: Negative for arthralgias and myalgias.  Neurological: Negative for dizziness and headaches.  Psychiatric/Behavioral: Negative for dysphoric mood.     Objective:  BP 124/80   Pulse 62   Temp 97.6 F (36.4 C)   Ht 5\' 7"  (1.702 m)   Wt 179 lb (81.2 kg)   SpO2 98%   BMI 28.04 kg/m    BP/Weight 04/14/2020 01/27/2020 6/38/4665  Systolic BP 993 570 -  Diastolic BP 80 79 -  Wt. (Lbs) 179 - 170  BMI 28.04 - 26.63    Physical Exam Vitals reviewed.  Constitutional:      Appearance: Normal appearance.  HENT:     Right Ear: Tympanic membrane, ear canal and external ear normal.     Left Ear: Tympanic membrane, ear canal and external ear normal.     Nose: No congestion or rhinorrhea.     Mouth/Throat:     Mouth: Mucous membranes  are moist.     Pharynx: No oropharyngeal exudate or posterior oropharyngeal erythema.  Neck:     Vascular: No carotid bruit.  Cardiovascular:     Rate and Rhythm: Normal rate and regular rhythm.     Heart sounds: Normal heart sounds.  Pulmonary:     Effort: Pulmonary effort is normal. No respiratory distress.     Breath sounds: Normal breath sounds. No wheezing, rhonchi or rales.  Abdominal:     General: Bowel sounds are normal.     Palpations: Abdomen is soft.     Tenderness: There is no abdominal tenderness.  Musculoskeletal:        General: Normal range of motion.     Cervical back: Normal range of motion.  Lymphadenopathy:     Cervical: No cervical adenopathy.  Skin:    General: Skin is warm.     Findings: No lesion or rash.  Neurological:     Mental Status: He is alert and oriented to person, place, and time.  Psychiatric:        Mood and Affect: Mood normal.        Behavior: Behavior normal.     Lab Results  Component Value Date   WBC 6.5 04/14/2020   HGB 15.3 04/14/2020   HCT 44.9 04/14/2020   PLT 222 04/14/2020   GLUCOSE 95 04/14/2020   CHOL 256 (H) 04/14/2020   TRIG 163 (H) 04/14/2020   HDL 43 04/14/2020   LDLCALC 183 (H) 04/14/2020   ALT 16 04/14/2020   AST 16 04/14/2020   NA 138 04/14/2020   K 4.5 04/14/2020   CL 101 04/14/2020   CREATININE 1.20 04/14/2020   BUN 11 04/14/2020   CO2 24 04/14/2020   TSH 0.938 04/14/2020   INR 1.2 01/25/2020      Assessment & Plan:  1. Routine medical exam Overall pt  is very healthy. - CBC with Differential/Platelet - Comprehensive metabolic panel - Lipid panel  2. Screening for prostate cancer - PSA  3. Need for vaccination - Varicella-zoster vaccine IM (Shingrix)  Body mass index is 28.04 kg/m.   These are the goals we discussed:  Recommend continue to work on eating healthy diet and exercise.   This is a list of the screening recommended for you and due dates:  Health Maintenance  Topic Date Due  .  Hepatitis C: One time screening is recommended by Center for Disease Control  (CDC) for  adults born from [birth year] through 1966.   Never done  . Flu Shot  03/28/2020  . Colon Cancer Screening  09/08/2020  . Tetanus Vaccine  01/24/2030  . COVID-19 Vaccine  Completed  . HIV Screening  Completed    Follow-up: Return in about 3 months (around 07/15/2020) for fasting follow up appt and second shingrix vaccine. .  An After Visit Summary was printed and given to the patient.  Rochel Brome [facility] [phone]

## 2020-04-15 LAB — CBC WITH DIFFERENTIAL/PLATELET
Basophils Absolute: 0.1 10*3/uL (ref 0.0–0.2)
Basos: 1 %
EOS (ABSOLUTE): 0.4 10*3/uL (ref 0.0–0.4)
Eos: 6 %
Hematocrit: 44.9 % (ref 37.5–51.0)
Hemoglobin: 15.3 g/dL (ref 13.0–17.7)
Immature Grans (Abs): 0 10*3/uL (ref 0.0–0.1)
Immature Granulocytes: 1 %
Lymphocytes Absolute: 1.7 10*3/uL (ref 0.7–3.1)
Lymphs: 26 %
MCH: 28.9 pg (ref 26.6–33.0)
MCHC: 34.1 g/dL (ref 31.5–35.7)
MCV: 85 fL (ref 79–97)
Monocytes Absolute: 0.6 10*3/uL (ref 0.1–0.9)
Monocytes: 9 %
Neutrophils Absolute: 3.8 10*3/uL (ref 1.4–7.0)
Neutrophils: 57 %
Platelets: 222 10*3/uL (ref 150–450)
RBC: 5.29 x10E6/uL (ref 4.14–5.80)
RDW: 13.2 % (ref 11.6–15.4)
WBC: 6.5 10*3/uL (ref 3.4–10.8)

## 2020-04-15 LAB — PSA: Prostate Specific Ag, Serum: 1.8 ng/mL (ref 0.0–4.0)

## 2020-04-15 LAB — COMPREHENSIVE METABOLIC PANEL
ALT: 16 IU/L (ref 0–44)
AST: 16 IU/L (ref 0–40)
Albumin/Globulin Ratio: 2 (ref 1.2–2.2)
Albumin: 4.6 g/dL (ref 3.8–4.9)
Alkaline Phosphatase: 181 IU/L — ABNORMAL HIGH (ref 48–121)
BUN/Creatinine Ratio: 9 — ABNORMAL LOW (ref 10–24)
BUN: 11 mg/dL (ref 8–27)
Bilirubin Total: 1.1 mg/dL (ref 0.0–1.2)
CO2: 24 mmol/L (ref 20–29)
Calcium: 9.6 mg/dL (ref 8.6–10.2)
Chloride: 101 mmol/L (ref 96–106)
Creatinine, Ser: 1.2 mg/dL (ref 0.76–1.27)
GFR calc Af Amer: 76 mL/min/{1.73_m2} (ref >59)
GFR calc non Af Amer: 65 mL/min/{1.73_m2} (ref >59)
Globulin, Total: 2.3 g/dL (ref 1.5–4.5)
Glucose: 95 mg/dL (ref 65–99)
Potassium: 4.5 mmol/L (ref 3.5–5.2)
Sodium: 138 mmol/L (ref 134–144)
Total Protein: 6.9 g/dL (ref 6.0–8.5)

## 2020-04-15 LAB — LIPID PANEL
Chol/HDL Ratio: 6 ratio — ABNORMAL HIGH (ref 0.0–5.0)
Cholesterol, Total: 256 mg/dL — ABNORMAL HIGH (ref 100–199)
HDL: 43 mg/dL (ref >39)
LDL Chol Calc (NIH): 183 mg/dL — ABNORMAL HIGH (ref 0–99)
Triglycerides: 163 mg/dL — ABNORMAL HIGH (ref 0–149)
VLDL Cholesterol Cal: 30 mg/dL (ref 5–40)

## 2020-04-15 LAB — TSH: TSH: 0.938 u[IU]/mL (ref 0.450–4.500)

## 2020-04-15 LAB — CARDIOVASCULAR RISK ASSESSMENT

## 2020-07-20 ENCOUNTER — Encounter: Payer: Self-pay | Admitting: Family Medicine

## 2020-07-20 ENCOUNTER — Ambulatory Visit (INDEPENDENT_AMBULATORY_CARE_PROVIDER_SITE_OTHER): Payer: Commercial Managed Care - PPO | Admitting: Family Medicine

## 2020-07-20 ENCOUNTER — Other Ambulatory Visit: Payer: Self-pay

## 2020-07-20 VITALS — BP 130/80 | HR 69 | Temp 97.6°F | Ht 67.0 in | Wt 184.0 lb

## 2020-07-20 DIAGNOSIS — E782 Mixed hyperlipidemia: Secondary | ICD-10-CM

## 2020-07-20 DIAGNOSIS — E663 Overweight: Secondary | ICD-10-CM

## 2020-07-20 DIAGNOSIS — I4719 Other supraventricular tachycardia: Secondary | ICD-10-CM

## 2020-07-20 DIAGNOSIS — Z23 Encounter for immunization: Secondary | ICD-10-CM | POA: Diagnosis not present

## 2020-07-20 DIAGNOSIS — I471 Supraventricular tachycardia: Secondary | ICD-10-CM

## 2020-07-20 DIAGNOSIS — Z6828 Body mass index (BMI) 28.0-28.9, adult: Secondary | ICD-10-CM

## 2020-07-20 MED ORDER — ROSUVASTATIN CALCIUM 20 MG PO TABS: 20.0000 mg | ORAL_TABLET | Freq: Every day | ORAL | 2 refills | Status: DC

## 2020-07-20 NOTE — Progress Notes (Signed)
Subjective:  Patient ID: Robert Arroyo, male    DOB: [birth year]  Age: 59 y.o. MRN: [id number]  Chief Complaint  Patient presents with  . Hyperlipidemia    HPI   Patient states that he takes metoprolol for PAT. Not atrial fibrillation. Has not been on blood thinners previously.   Hyperlipidemia- diagnosed 04/14/2020 was suggested that patient start on crestor 20 mg, patient decline at the time however he is willing to reconsider. He is not eating healthy or exercising.   Current Outpatient Medications on File Prior to Visit  Medication Sig Dispense Refill  . cetirizine (ZYRTEC) 10 MG tablet Take 10 mg by mouth daily as needed for allergies.    . metoprolol succinate (TOPROL-XL) 25 MG 24 hr tablet Take 25 mg by mouth daily.     No current facility-administered medications on file prior to visit.   Past Medical History:  Diagnosis Date  . A-fib ([place]) 2003  . Closed fracture of multiple pubic rami ([place]) 01/2020  . Hyperlipidemia   . Hypoxia   . Pneumonia due to COVID-19 virus 11/06/2019  . Right inguinal hernia    Past Surgical History:  Procedure Laterality Date  . CARDIAC CATHETERIZATION  2003    Family History  Problem Relation Age of Onset  . Leukemia Mother   . Glaucoma Father    Social History   Socioeconomic History  . Marital status: Married    Spouse name: Not on file  . Number of children: 0  . Years of education: Not on file  . Highest education level: Not on file  Occupational History  . Not on file  Tobacco Use  . Smoking status: Never Smoker  . Smokeless tobacco: Never Used  Vaping Use  . Vaping Use: Never used  Substance and Sexual Activity  . Alcohol use: Yes    Comment: red wine rarely  . Drug use: Never  . Sexual activity: Not on file  Other Topics Concern  . Not on file  Social History Narrative  . Not on file   Social Determinants of Health   Financial Resource Strain:   . Difficulty of Paying Living Expenses: Not on file    Food Insecurity:   . Worried About Charity fundraiser in the Last Year: Not on file  . Ran Out of Food in the Last Year: Not on file  Transportation Needs:   . Lack of Transportation (Medical): Not on file  . Lack of Transportation (Non-Medical): Not on file  Physical Activity:   . Days of Exercise per Week: Not on file  . Minutes of Exercise per Session: Not on file  Stress:   . Feeling of Stress : Not on file  Social Connections:   . Frequency of Communication with Friends and Family: Not on file  . Frequency of Social Gatherings with Friends and Family: Not on file  . Attends Religious Services: Not on file  . Active Member of Clubs or Organizations: Not on file  . Attends Archivist Meetings: Not on file  . Marital Status: Not on file    Review of Systems  Constitutional: Negative for chills, diaphoresis, fatigue and fever.  HENT: Negative for congestion, ear pain and sore throat.   Respiratory: Negative for cough and shortness of breath.   Cardiovascular: Negative for chest pain and leg swelling.  Gastrointestinal: Negative for abdominal pain, constipation, diarrhea, nausea and vomiting.  Genitourinary: Negative for dysuria and urgency.  Musculoskeletal: Negative for arthralgias and  myalgias.  Neurological: Negative for dizziness and headaches.  Psychiatric/Behavioral: Negative for dysphoric mood.     Objective:  BP 130/80   Pulse 69   Temp 97.6 F (36.4 C)   Ht 5\' 7"  (1.702 m)   Wt 184 lb (83.5 kg)   SpO2 99%   BMI 28.82 kg/m   BP/Weight 07/20/2020 2/94/7654 01/30/353  Systolic BP 656 812 751  Diastolic BP 80 80 79  Wt. (Lbs) 184 179 -  BMI 28.82 28.04 -    Physical Exam Vitals reviewed.  Constitutional:      Appearance: Normal appearance.  Neck:     Vascular: No carotid bruit.  Cardiovascular:     Rate and Rhythm: Normal rate and regular rhythm.     Pulses: Normal pulses.     Heart sounds: Normal heart sounds.  Pulmonary:     Effort:  Pulmonary effort is normal.     Breath sounds: Normal breath sounds. No wheezing, rhonchi or rales.  Abdominal:     General: Bowel sounds are normal.     Palpations: Abdomen is soft.     Tenderness: There is no abdominal tenderness.  Neurological:     Mental Status: He is alert and oriented to person, place, and time.  Psychiatric:        Mood and Affect: Mood normal.        Behavior: Behavior normal.     Diabetic Foot Exam - Simple   No data filed       Lab Results  Component Value Date   WBC 6.5 04/14/2020   HGB 15.3 04/14/2020   HCT 44.9 04/14/2020   PLT 222 04/14/2020   GLUCOSE 95 04/14/2020   CHOL 256 (H) 04/14/2020   TRIG 163 (H) 04/14/2020   HDL 43 04/14/2020   LDLCALC 183 (H) 04/14/2020   ALT 16 04/14/2020   AST 16 04/14/2020   NA 138 04/14/2020   K 4.5 04/14/2020   CL 101 04/14/2020   CREATININE 1.20 04/14/2020   BUN 11 04/14/2020   CO2 24 04/14/2020   TSH 0.938 04/14/2020   INR 1.2 01/25/2020      Assessment & Plan:   1. Mixed hyperlipidemia Low fat diet and exercise.  Start crestor 20 mg once daily - rosuvastatin (CRESTOR) 20 MG tablet; Take 1 tablet (20 mg total) by mouth daily.  Dispense: 30 tablet; Refill: 2 - CBC with Differential/Platelet - Comprehensive metabolic panel - Lipid panel  2. PAT (paroxysmal atrial tachycardia) (HCC) Continue metoprolol  3. Overweight with body mass index (BMI) of 28 to 28.9 in adult Recommend continue to work on eating healthy diet and exercise.  4. Need for vaccination - Varicella-zoster vaccine IM (Shingrix)    Meds ordered this encounter  Medications  . rosuvastatin (CRESTOR) 20 MG tablet    Sig: Take 1 tablet (20 mg total) by mouth daily.    Dispense:  30 tablet    Refill:  2    Orders Placed This Encounter  Procedures  . Varicella-zoster vaccine IM (Shingrix)  . CBC with Differential/Platelet  . Comprehensive metabolic panel  . Lipid panel     Follow-up: Return in about 3 months (around  10/20/2020) for fasting.  An After Visit Summary was printed and given to the patient.  Rochel Brome, MD [facility] [phone]

## 2020-07-21 LAB — COMPREHENSIVE METABOLIC PANEL
ALT: 21 IU/L (ref 0–44)
AST: 24 IU/L (ref 0–40)
Albumin/Globulin Ratio: 2.2 (ref 1.2–2.2)
Albumin: 4.6 g/dL (ref 3.8–4.9)
Alkaline Phosphatase: 143 IU/L — ABNORMAL HIGH (ref 44–121)
BUN/Creatinine Ratio: 13 (ref 10–24)
BUN: 14 mg/dL (ref 8–27)
Bilirubin Total: 1.1 mg/dL (ref 0.0–1.2)
CO2: 23 mmol/L (ref 20–29)
Calcium: 9.1 mg/dL (ref 8.6–10.2)
Chloride: 102 mmol/L (ref 96–106)
Creatinine, Ser: 1.09 mg/dL (ref 0.76–1.27)
GFR calc Af Amer: 85 mL/min/{1.73_m2} (ref >59)
GFR calc non Af Amer: 73 mL/min/{1.73_m2} (ref >59)
Globulin, Total: 2.1 g/dL (ref 1.5–4.5)
Glucose: 92 mg/dL (ref 65–99)
Potassium: 4.6 mmol/L (ref 3.5–5.2)
Sodium: 139 mmol/L (ref 134–144)
Total Protein: 6.7 g/dL (ref 6.0–8.5)

## 2020-07-21 LAB — LIPID PANEL
Chol/HDL Ratio: 5 ratio (ref 0.0–5.0)
Cholesterol, Total: 245 mg/dL — ABNORMAL HIGH (ref 100–199)
HDL: 49 mg/dL (ref >39)
LDL Chol Calc (NIH): 173 mg/dL — ABNORMAL HIGH (ref 0–99)
Triglycerides: 128 mg/dL (ref 0–149)
VLDL Cholesterol Cal: 23 mg/dL (ref 5–40)

## 2020-07-21 LAB — CBC WITH DIFFERENTIAL/PLATELET
Basophils Absolute: 0.1 10*3/uL (ref 0.0–0.2)
Basos: 1 %
EOS (ABSOLUTE): 0.4 10*3/uL (ref 0.0–0.4)
Eos: 7 %
Hematocrit: 44.6 % (ref 37.5–51.0)
Hemoglobin: 15.3 g/dL (ref 13.0–17.7)
Immature Grans (Abs): 0.1 10*3/uL (ref 0.0–0.1)
Immature Granulocytes: 1 %
Lymphocytes Absolute: 1.7 10*3/uL (ref 0.7–3.1)
Lymphs: 29 %
MCH: 29.7 pg (ref 26.6–33.0)
MCHC: 34.3 g/dL (ref 31.5–35.7)
MCV: 86 fL (ref 79–97)
Monocytes Absolute: 0.7 10*3/uL (ref 0.1–0.9)
Monocytes: 11 %
Neutrophils Absolute: 3.1 10*3/uL (ref 1.4–7.0)
Neutrophils: 51 %
Platelets: 217 10*3/uL (ref 150–450)
RBC: 5.16 x10E6/uL (ref 4.14–5.80)
RDW: 13.1 % (ref 11.6–15.4)
WBC: 6 10*3/uL (ref 3.4–10.8)

## 2020-07-21 LAB — CARDIOVASCULAR RISK ASSESSMENT

## 2020-08-18 ENCOUNTER — Encounter: Payer: Self-pay | Admitting: Family Medicine

## 2020-09-07 ENCOUNTER — Other Ambulatory Visit: Payer: Self-pay

## 2020-09-07 ENCOUNTER — Other Ambulatory Visit: Payer: Self-pay | Admitting: Family Medicine

## 2020-09-07 DIAGNOSIS — E782 Mixed hyperlipidemia: Secondary | ICD-10-CM

## 2020-09-07 MED ORDER — ROSUVASTATIN CALCIUM 20 MG PO TABS: 20.0000 mg | ORAL_TABLET | Freq: Every day | ORAL | 2 refills | Status: DC

## 2020-09-07 MED ORDER — SILDENAFIL CITRATE 20 MG PO TABS: ORAL_TABLET | ORAL | 0 refills | Status: DC

## 2020-09-14 ENCOUNTER — Other Ambulatory Visit: Payer: Self-pay | Admitting: Family Medicine

## 2020-09-14 DIAGNOSIS — E782 Mixed hyperlipidemia: Secondary | ICD-10-CM

## 2020-09-14 MED ORDER — ROSUVASTATIN CALCIUM 20 MG PO TABS: 20.0000 mg | ORAL_TABLET | Freq: Every day | ORAL | 0 refills | Status: DC

## 2020-10-23 NOTE — Progress Notes (Signed)
Subjective:  Patient ID: Robert Arroyo, male    DOB: 1960-10-15  Age: 60 y.o. MRN: [id number]  Chief Complaint  Patient presents with  . Hyperlipidemia    HPI Mixed hyperlipidemia: eating healthy sometimes. Walks in evening.  - Patient is currently taking rosuvastatin 20 mg daily.  PAT (paroxysmal atrial tachycardia) - Patient takes metoprolol. No palpitations  Current Outpatient Medications on File Prior to Visit  Medication Sig Dispense Refill  . cetirizine (ZYRTEC) 10 MG tablet Take 10 mg by mouth daily as needed for allergies.    . metoprolol succinate (TOPROL-XL) 25 MG 24 hr tablet Take 25 mg by mouth daily.    . rosuvastatin (CRESTOR) 20 MG tablet Take 1 tablet (20 mg total) by mouth daily. 90 tablet 0   No current facility-administered medications on file prior to visit.   Past Medical History:  Diagnosis Date  . A-fib ([place]) 2003  . Closed fracture of multiple pubic rami ([place]) 01/2020  . Hyperlipidemia   . Hypoxia   . Pneumonia due to COVID-19 virus 11/06/2019  . Right inguinal hernia    Past Surgical History:  Procedure Laterality Date  . CARDIAC CATHETERIZATION  2003    Family History  Problem Relation Age of Onset  . Leukemia Mother   . Glaucoma Father    Social History   Socioeconomic History  . Marital status: Married    Spouse name: Not on file  . Number of children: 0  . Years of education: Not on file  . Highest education level: Not on file  Occupational History  . Not on file  Tobacco Use  . Smoking status: Never Smoker  . Smokeless tobacco: Never Used  Vaping Use  . Vaping Use: Never used  Substance and Sexual Activity  . Alcohol use: Yes    Comment: red wine rarely  . Drug use: Never  . Sexual activity: Not on file  Other Topics Concern  . Not on file  Social History Narrative  . Not on file   Social Determinants of Health   Financial Resource Strain: Not on file  Food Insecurity: Not on file  Transportation Needs: Not  on file  Physical Activity: Not on file  Stress: Not on file  Social Connections: Not on file    Review of Systems  Constitutional: Negative for chills, fatigue, fever and unexpected weight change.  HENT: Negative for congestion, ear pain, sinus pain and sore throat.   Cardiovascular: Negative for chest pain and palpitations.  Gastrointestinal: Negative for abdominal pain, blood in stool, constipation, diarrhea, nausea and vomiting.  Endocrine: Negative for polydipsia.  Genitourinary: Negative for dysuria.  Musculoskeletal: Negative for back pain.  Skin: Negative for rash.  Neurological: Negative for headaches.  Psychiatric/Behavioral: Negative for dysphoric mood. The patient is not nervous/anxious.      Objective:  BP 124/60   Pulse 68   Temp (!) 97.4 F (36.3 C)   Resp 16   Ht 5\' 7"  (1.702 m)   Wt 190 lb (86.2 kg)   BMI 29.76 kg/m   BP/Weight 10/25/2020 07/20/2020 6/73/4193  Systolic BP 790 240 973  Diastolic BP 60 80 80  Wt. (Lbs) 190 184 179  BMI 29.76 28.82 28.04    Physical Exam Constitutional:      Appearance: Normal appearance.  Neck:     Vascular: No carotid bruit.  Cardiovascular:     Rate and Rhythm: Normal rate and regular rhythm.     Heart sounds: Normal heart sounds.  Pulmonary:     Effort: Pulmonary effort is normal.     Breath sounds: Normal breath sounds.  Abdominal:     General: Bowel sounds are normal.     Palpations: Abdomen is soft. There is no mass.     Tenderness: There is no abdominal tenderness.  Neurological:     Mental Status: He is alert.  Psychiatric:        Mood and Affect: Mood normal.        Behavior: Behavior normal.        Thought Content: Thought content normal.     Diabetic Foot Exam - Simple   No data filed      Lab Results  Component Value Date   WBC 6.0 07/20/2020   HGB 15.3 07/20/2020   HCT 44.6 07/20/2020   PLT 217 07/20/2020   GLUCOSE 92 07/20/2020   CHOL 245 (H) 07/20/2020   TRIG 128 07/20/2020   HDL  49 07/20/2020   LDLCALC 173 (H) 07/20/2020   ALT 21 07/20/2020   AST 24 07/20/2020   NA 139 07/20/2020   K 4.6 07/20/2020   CL 102 07/20/2020   CREATININE 1.09 07/20/2020   BUN 14 07/20/2020   CO2 23 07/20/2020   TSH 0.938 04/14/2020   INR 1.2 01/25/2020      Assessment & Plan:   1. Mixed hyperlipidemia Await labs to establish control.  Continue to work on eating a healthy diet and exercise.  Labs drawn today.  - Comprehensive metabolic panel - Lipid panel - CBC with Differential/Platelet  2. PAT (paroxysmal atrial tachycardia) (HCC)  The current medical regimen is effective;  continue present plan and medications.   3. Erectile dysfunction Sildenafil rx given.  4. bmi 29 Continue to work on eating a low fat diet and exercise.  Meds ordered this encounter  Medications  . sildenafil (VIAGRA) 100 MG tablet    Sig: One daily one hour prior to intercourse    Dispense:  10 tablet    Refill:  5    Orders Placed This Encounter  Procedures  . Comprehensive metabolic panel  . Lipid panel  . CBC with Differential/Platelet     Follow-up: No follow-ups on file.  An After Visit Summary was printed and given to the patient.  Rochel Brome, MD [facility] [phone]

## 2020-10-25 ENCOUNTER — Other Ambulatory Visit: Payer: Self-pay

## 2020-10-25 ENCOUNTER — Ambulatory Visit (INDEPENDENT_AMBULATORY_CARE_PROVIDER_SITE_OTHER): Payer: Managed Care, Other (non HMO) | Admitting: Family Medicine

## 2020-10-25 ENCOUNTER — Encounter: Payer: Self-pay | Admitting: Family Medicine

## 2020-10-25 ENCOUNTER — Ambulatory Visit: Payer: Commercial Managed Care - PPO | Admitting: Family Medicine

## 2020-10-25 VITALS — BP 124/60 | HR 68 | Temp 97.4°F | Resp 16 | Ht 67.0 in | Wt 190.0 lb

## 2020-10-25 DIAGNOSIS — Z6829 Body mass index (BMI) 29.0-29.9, adult: Secondary | ICD-10-CM | POA: Diagnosis not present

## 2020-10-25 DIAGNOSIS — I471 Supraventricular tachycardia: Secondary | ICD-10-CM

## 2020-10-25 DIAGNOSIS — E782 Mixed hyperlipidemia: Secondary | ICD-10-CM | POA: Diagnosis not present

## 2020-10-25 DIAGNOSIS — N528 Other male erectile dysfunction: Secondary | ICD-10-CM | POA: Diagnosis not present

## 2020-10-25 MED ORDER — SILDENAFIL CITRATE 100 MG PO TABS: ORAL_TABLET | ORAL | 5 refills | Status: DC

## 2020-10-26 LAB — CBC WITH DIFFERENTIAL/PLATELET
Basophils Absolute: 0.1 10*3/uL (ref 0.0–0.2)
Basos: 1 %
EOS (ABSOLUTE): 0.4 10*3/uL (ref 0.0–0.4)
Eos: 6 %
Hematocrit: 45.2 % (ref 37.5–51.0)
Hemoglobin: 15.3 g/dL (ref 13.0–17.7)
Immature Grans (Abs): 0 10*3/uL (ref 0.0–0.1)
Immature Granulocytes: 1 %
Lymphocytes Absolute: 1.5 10*3/uL (ref 0.7–3.1)
Lymphs: 26 %
MCH: 29.9 pg (ref 26.6–33.0)
MCHC: 33.8 g/dL (ref 31.5–35.7)
MCV: 88 fL (ref 79–97)
Monocytes Absolute: 0.6 10*3/uL (ref 0.1–0.9)
Monocytes: 10 %
Neutrophils Absolute: 3.3 10*3/uL (ref 1.4–7.0)
Neutrophils: 56 %
Platelets: 199 10*3/uL (ref 150–450)
RBC: 5.12 x10E6/uL (ref 4.14–5.80)
RDW: 12.6 % (ref 11.6–15.4)
WBC: 5.8 10*3/uL (ref 3.4–10.8)

## 2020-10-26 LAB — COMPREHENSIVE METABOLIC PANEL
ALT: 34 IU/L (ref 0–44)
AST: 29 IU/L (ref 0–40)
Albumin/Globulin Ratio: 2 (ref 1.2–2.2)
Albumin: 4.4 g/dL (ref 3.8–4.8)
Alkaline Phosphatase: 112 IU/L (ref 44–121)
BUN/Creatinine Ratio: 15 (ref 10–24)
BUN: 17 mg/dL (ref 8–27)
Bilirubin Total: 0.8 mg/dL (ref 0.0–1.2)
CO2: 22 mmol/L (ref 20–29)
Calcium: 8.8 mg/dL (ref 8.6–10.2)
Chloride: 103 mmol/L (ref 96–106)
Creatinine, Ser: 1.16 mg/dL (ref 0.76–1.27)
Globulin, Total: 2.2 g/dL (ref 1.5–4.5)
Glucose: 109 mg/dL — ABNORMAL HIGH (ref 65–99)
Potassium: 4.8 mmol/L (ref 3.5–5.2)
Sodium: 140 mmol/L (ref 134–144)
Total Protein: 6.6 g/dL (ref 6.0–8.5)
eGFR: 72 mL/min/{1.73_m2} (ref >59)

## 2020-10-26 LAB — LIPID PANEL
Chol/HDL Ratio: 3.4 ratio (ref 0.0–5.0)
Cholesterol, Total: 158 mg/dL (ref 100–199)
HDL: 46 mg/dL (ref >39)
LDL Chol Calc (NIH): 88 mg/dL (ref 0–99)
Triglycerides: 138 mg/dL (ref 0–149)
VLDL Cholesterol Cal: 24 mg/dL (ref 5–40)

## 2020-10-26 LAB — CARDIOVASCULAR RISK ASSESSMENT

## 2020-10-28 LAB — HGB A1C W/O EAG: Hgb A1c MFr Bld: 5.5 % (ref 4.8–5.6)

## 2020-10-28 LAB — SPECIMEN STATUS REPORT

## 2020-11-15 ENCOUNTER — Other Ambulatory Visit: Payer: Self-pay | Admitting: Family Medicine

## 2021-01-24 NOTE — Progress Notes (Signed)
Subjective:  Patient ID: Robert Arroyo, male    DOB: 1960/11/19  Age: 60 y.o. MRN: [id number]  Chief Complaint  Patient presents with  . Hyperlipidemia    HPI Mixed hyperlipidemia Crestor 20 mg once daily. Eats healthy and exercises (yardwork.)  Walks. PAT (paroxysmal atrial tachycardia) (HCC) On metoprolol xl 25 mg once daily.  Other male erectile dysfunction On viagra.  Allergic rhinitis: on zyrtec.   Current Outpatient Medications on File Prior to Visit  Medication Sig Dispense Refill  . cetirizine (ZYRTEC) 10 MG tablet Take 10 mg by mouth daily as needed for allergies.    . metoprolol succinate (TOPROL-XL) 25 MG 24 hr tablet TAKE 1 TABLET DAILY 90 tablet 3  . rosuvastatin (CRESTOR) 20 MG tablet Take 1 tablet (20 mg total) by mouth daily. 90 tablet 0  . sildenafil (VIAGRA) 100 MG tablet One daily one hour prior to intercourse 10 tablet 5   No current facility-administered medications on file prior to visit.   Past Medical History:  Diagnosis Date  . A-fib ([place]) 2003  . Closed fracture of multiple pubic rami ([place]) 01/2020  . Hyperlipidemia   . Hypoxia   . Pneumonia due to COVID-19 virus 11/06/2019  . Right inguinal hernia    Past Surgical History:  Procedure Laterality Date  . CARDIAC CATHETERIZATION  2003    Family History  Problem Relation Age of Onset  . Leukemia Mother   . Glaucoma Father    Social History   Socioeconomic History  . Marital status: Married    Spouse name: Not on file  . Number of children: 0  . Years of education: Not on file  . Highest education level: Not on file  Occupational History  . Not on file  Tobacco Use  . Smoking status: Never Smoker  . Smokeless tobacco: Never Used  Vaping Use  . Vaping Use: Never used  Substance and Sexual Activity  . Alcohol use: Yes    Comment: red wine rarely  . Drug use: Never  . Sexual activity: Not on file  Other Topics Concern  . Not on file  Social History Narrative  . Not on  file   Social Determinants of Health   Financial Resource Strain: Not on file  Food Insecurity: Not on file  Transportation Needs: Not on file  Physical Activity: Not on file  Stress: Not on file  Social Connections: Not on file    Review of Systems  Constitutional: Negative for chills, fatigue, fever and unexpected weight change.  HENT: Positive for rhinorrhea. Negative for congestion, ear pain, sinus pain and sore throat.   Cardiovascular: Negative for chest pain and palpitations.  Gastrointestinal: Negative for abdominal pain, blood in stool, constipation, diarrhea, nausea and vomiting.  Endocrine: Negative for polydipsia, polyphagia and polyuria.  Genitourinary: Negative for dysuria.  Musculoskeletal: Negative for arthralgias, back pain and myalgias.  Skin: Negative for rash.  Neurological: Negative for dizziness and headaches.  Psychiatric/Behavioral: Negative for dysphoric mood. The patient is not nervous/anxious.      Objective:  BP 128/86   Pulse 66   Temp (!) 97.3 F (36.3 C)   Ht 5\' 7"  (1.702 m)   Wt 186 lb (84.4 kg)   SpO2 99%   BMI 29.13 kg/m   BP/Weight 01/25/2021 10/25/2020 38/25/0539  Systolic BP 767 341 937  Diastolic BP 86 60 80  Wt. (Lbs) 186 190 184  BMI 29.13 29.76 28.82    Physical Exam Constitutional:  Appearance: Normal appearance.  Cardiovascular:     Rate and Rhythm: Normal rate and regular rhythm.     Pulses: Normal pulses.     Heart sounds: Normal heart sounds.  Pulmonary:     Effort: Pulmonary effort is normal.     Breath sounds: Normal breath sounds.  Abdominal:     General: Bowel sounds are normal.     Palpations: Abdomen is soft. There is no mass.     Tenderness: There is no abdominal tenderness.  Neurological:     Mental Status: He is alert.  Psychiatric:        Mood and Affect: Mood normal.        Behavior: Behavior normal.        Thought Content: Thought content normal.     Diabetic Foot Exam - Simple   No data  filed      Lab Results  Component Value Date   WBC 5.8 10/25/2020   HGB 15.3 10/25/2020   HCT 45.2 10/25/2020   PLT 199 10/25/2020   GLUCOSE 109 (H) 10/25/2020   CHOL 158 10/25/2020   TRIG 138 10/25/2020   HDL 46 10/25/2020   LDLCALC 88 10/25/2020   ALT 34 10/25/2020   AST 29 10/25/2020   NA 140 10/25/2020   K 4.8 10/25/2020   CL 103 10/25/2020   CREATININE 1.16 10/25/2020   BUN 17 10/25/2020   CO2 22 10/25/2020   TSH 0.938 04/14/2020   INR 1.2 01/25/2020   HGBA1C 5.5 10/25/2020      Assessment & Plan:   1. Mixed hyperlipidemia Well controlled.  Send crestor refill after labs come back.  Continue to work on eating a healthy diet and exercise.  Labs drawn today.  - Comprehensive metabolic panel - Lipid panel - CBC with Differential/Platelet  2. PAT (paroxysmal atrial tachycardia) (HCC) The current medical regimen is effective;  continue present plan and medications.  3. Other male erectile dysfunction  The current medical regimen is effective;  continue present plan and medications.  4. BMI 29: Recommend continue to work on eating healthy diet and exercise.    Orders Placed This Encounter  Procedures  . Comprehensive metabolic panel  . Lipid panel  . CBC with Differential/Platelet    I,Bodhi Stenglein,acting as a scribe for Rochel Brome, MD.,have documented all relevant documentation on the behalf of Rochel Brome, MD,as directed by  Rochel Brome, MD while in the presence of Rochel Brome, MD.  Follow-up: Return in about 3 months (around 04/28/2021) for CPE.fasting.  An After Visit Summary was printed and given to the patient.  Rochel Brome, MD [facility] [phone]

## 2021-01-25 ENCOUNTER — Other Ambulatory Visit: Payer: Self-pay

## 2021-01-25 ENCOUNTER — Ambulatory Visit (INDEPENDENT_AMBULATORY_CARE_PROVIDER_SITE_OTHER): Payer: Managed Care, Other (non HMO) | Admitting: Family Medicine

## 2021-01-25 ENCOUNTER — Encounter: Payer: Self-pay | Admitting: Family Medicine

## 2021-01-25 VITALS — BP 128/86 | HR 66 | Temp 97.3°F | Ht 67.0 in | Wt 186.0 lb

## 2021-01-25 DIAGNOSIS — N528 Other male erectile dysfunction: Secondary | ICD-10-CM

## 2021-01-25 DIAGNOSIS — I471 Supraventricular tachycardia: Secondary | ICD-10-CM

## 2021-01-25 DIAGNOSIS — E782 Mixed hyperlipidemia: Secondary | ICD-10-CM | POA: Diagnosis not present

## 2021-01-25 DIAGNOSIS — Z6829 Body mass index (BMI) 29.0-29.9, adult: Secondary | ICD-10-CM

## 2021-01-25 DIAGNOSIS — I4719 Other supraventricular tachycardia: Secondary | ICD-10-CM

## 2021-01-26 LAB — COMPREHENSIVE METABOLIC PANEL
ALT: 31 IU/L (ref 0–44)
AST: 25 IU/L (ref 0–40)
Albumin/Globulin Ratio: 1.9 (ref 1.2–2.2)
Albumin: 4.5 g/dL (ref 3.8–4.8)
Alkaline Phosphatase: 119 IU/L (ref 44–121)
BUN/Creatinine Ratio: 10 (ref 10–24)
BUN: 11 mg/dL (ref 8–27)
Bilirubin Total: 0.6 mg/dL (ref 0.0–1.2)
CO2: 23 mmol/L (ref 20–29)
Calcium: 9.1 mg/dL (ref 8.6–10.2)
Chloride: 107 mmol/L — ABNORMAL HIGH (ref 96–106)
Creatinine, Ser: 1.13 mg/dL (ref 0.76–1.27)
Globulin, Total: 2.4 g/dL (ref 1.5–4.5)
Glucose: 109 mg/dL — ABNORMAL HIGH (ref 65–99)
Potassium: 5.5 mmol/L — ABNORMAL HIGH (ref 3.5–5.2)
Sodium: 144 mmol/L (ref 134–144)
Total Protein: 6.9 g/dL (ref 6.0–8.5)
eGFR: 74 mL/min/{1.73_m2} (ref >59)

## 2021-01-26 LAB — CBC WITH DIFFERENTIAL/PLATELET
Basophils Absolute: 0.1 10*3/uL (ref 0.0–0.2)
Basos: 1 %
EOS (ABSOLUTE): 0.3 10*3/uL (ref 0.0–0.4)
Eos: 4 %
Hematocrit: 45.4 % (ref 37.5–51.0)
Hemoglobin: 15.2 g/dL (ref 13.0–17.7)
Immature Grans (Abs): 0.1 10*3/uL (ref 0.0–0.1)
Immature Granulocytes: 1 %
Lymphocytes Absolute: 1.2 10*3/uL (ref 0.7–3.1)
Lymphs: 19 %
MCH: 29.6 pg (ref 26.6–33.0)
MCHC: 33.5 g/dL (ref 31.5–35.7)
MCV: 88 fL (ref 79–97)
Monocytes Absolute: 0.5 10*3/uL (ref 0.1–0.9)
Monocytes: 8 %
Neutrophils Absolute: 4.3 10*3/uL (ref 1.4–7.0)
Neutrophils: 67 %
Platelets: 211 10*3/uL (ref 150–450)
RBC: 5.14 x10E6/uL (ref 4.14–5.80)
RDW: 12.9 % (ref 11.6–15.4)
WBC: 6.4 10*3/uL (ref 3.4–10.8)

## 2021-01-26 LAB — LIPID PANEL
Chol/HDL Ratio: 3.4 ratio (ref 0.0–5.0)
Cholesterol, Total: 162 mg/dL (ref 100–199)
HDL: 47 mg/dL (ref >39)
LDL Chol Calc (NIH): 99 mg/dL (ref 0–99)
Triglycerides: 87 mg/dL (ref 0–149)
VLDL Cholesterol Cal: 16 mg/dL (ref 5–40)

## 2021-01-26 LAB — CARDIOVASCULAR RISK ASSESSMENT

## 2021-01-28 ENCOUNTER — Other Ambulatory Visit: Payer: Self-pay

## 2021-01-28 DIAGNOSIS — E782 Mixed hyperlipidemia: Secondary | ICD-10-CM

## 2021-01-28 DIAGNOSIS — E875 Hyperkalemia: Secondary | ICD-10-CM

## 2021-01-28 MED ORDER — ROSUVASTATIN CALCIUM 20 MG PO TABS: 20.0000 mg | ORAL_TABLET | Freq: Every day | ORAL | 0 refills | Status: DC

## 2021-02-03 ENCOUNTER — Other Ambulatory Visit: Payer: Self-pay

## 2021-02-03 ENCOUNTER — Ambulatory Visit: Payer: Managed Care, Other (non HMO)

## 2021-02-04 LAB — COMPREHENSIVE METABOLIC PANEL
ALT: 26 IU/L (ref 0–44)
AST: 23 IU/L (ref 0–40)
Albumin/Globulin Ratio: 2 (ref 1.2–2.2)
Albumin: 4.7 g/dL (ref 3.8–4.8)
Alkaline Phosphatase: 113 IU/L (ref 44–121)
BUN/Creatinine Ratio: 8 — ABNORMAL LOW (ref 10–24)
BUN: 10 mg/dL (ref 8–27)
Bilirubin Total: 0.7 mg/dL (ref 0.0–1.2)
CO2: 25 mmol/L (ref 20–29)
Calcium: 9.4 mg/dL (ref 8.6–10.2)
Chloride: 102 mmol/L (ref 96–106)
Creatinine, Ser: 1.19 mg/dL (ref 0.76–1.27)
Globulin, Total: 2.3 g/dL (ref 1.5–4.5)
Glucose: 98 mg/dL (ref 65–99)
Potassium: 4.5 mmol/L (ref 3.5–5.2)
Sodium: 139 mmol/L (ref 134–144)
Total Protein: 7 g/dL (ref 6.0–8.5)
eGFR: 69 mL/min/{1.73_m2} (ref >59)

## 2021-03-24 IMAGING — DX DG CHEST 1V PORT
1 series · 1 of 1 positions shown · non-contrast
Comparison: 11/05/2019

CLINICAL DATA: Shortness of breath, positive AZSH6-8U

EXAM:
PORTABLE CHEST 1 VIEW

[chest ap]
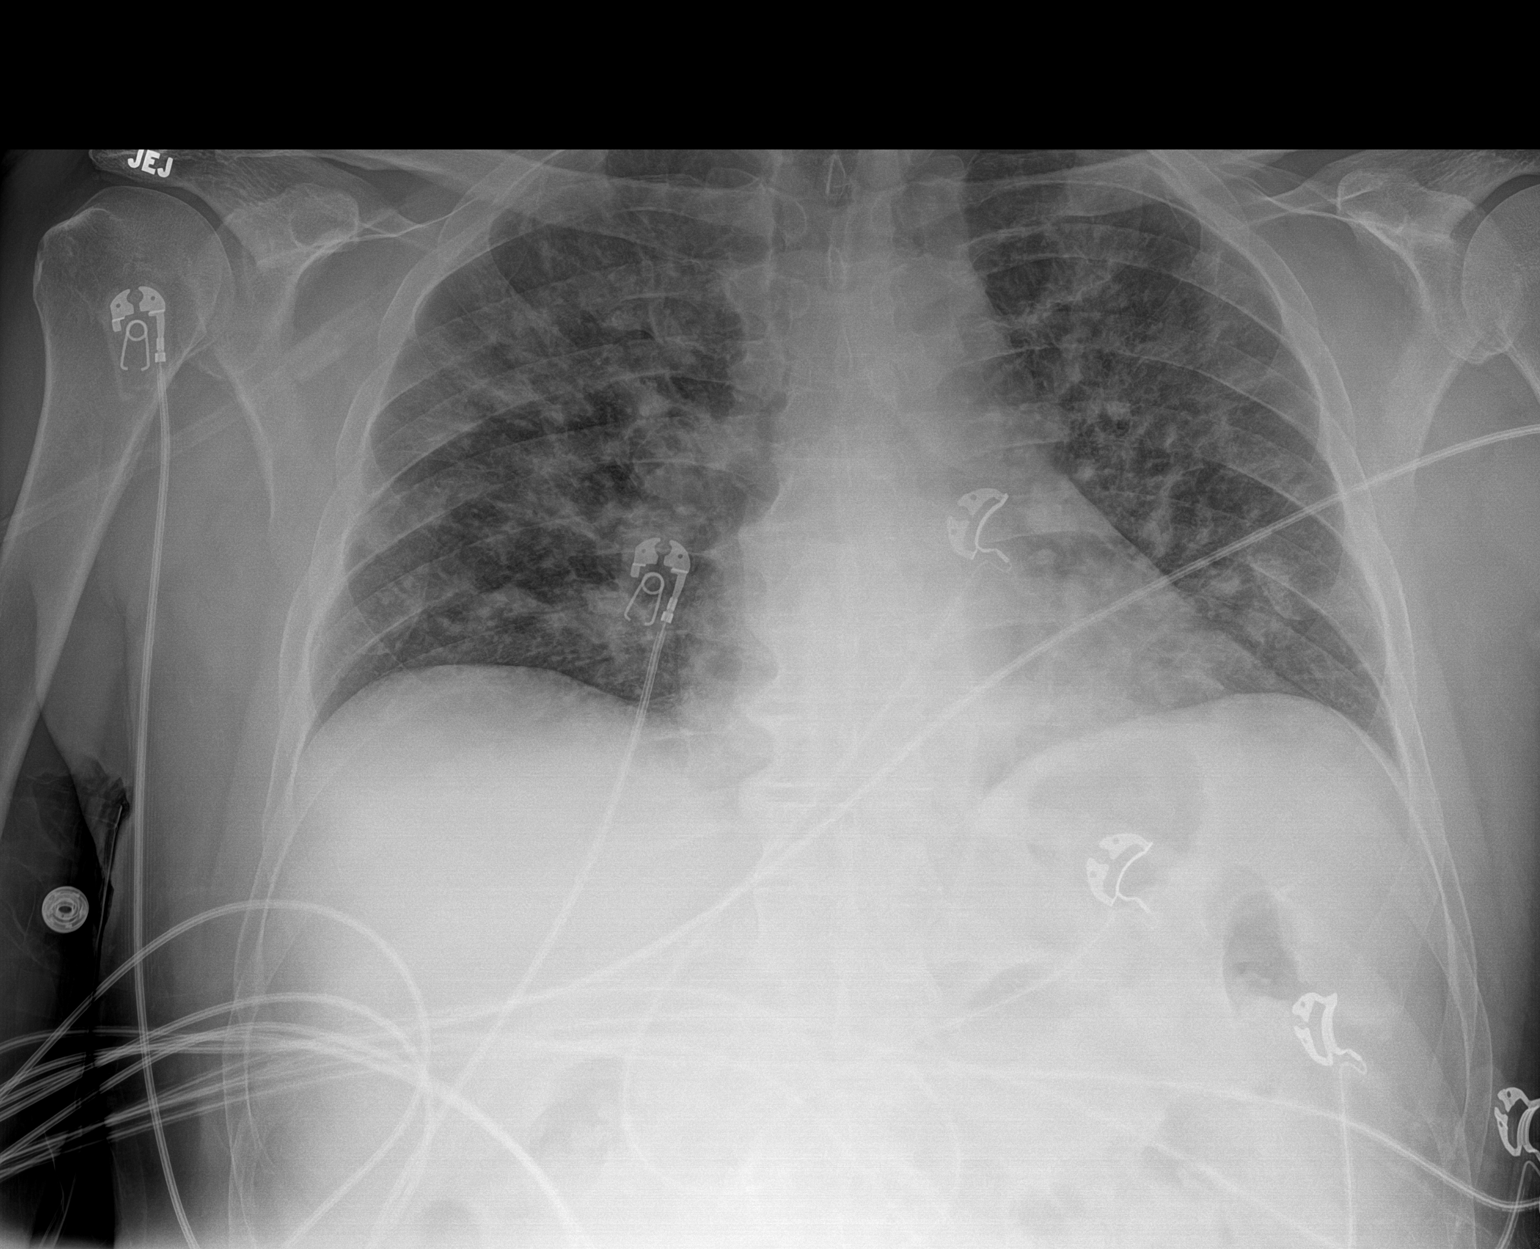

[1 of 1 positions shown; findings below may reference images not displayed]

FINDINGS: Cardiac shadow is stable from the prior exam from the previous day.
Lungs are well aerated bilaterally. Diffuse bilateral opacities are
noted consistent with the patient's given clinical history of
AZSH6-8U positivity.
IMPRESSION: Bilateral parenchymal opacities consistent with the given clinical
history of AZSH6-8U positivity. The degree of opacities have
increased slightly in the interval from the prior plain film.

## 2021-05-05 ENCOUNTER — Ambulatory Visit (INDEPENDENT_AMBULATORY_CARE_PROVIDER_SITE_OTHER): Payer: Managed Care, Other (non HMO) | Admitting: Family Medicine

## 2021-05-05 ENCOUNTER — Other Ambulatory Visit: Payer: Self-pay

## 2021-05-05 ENCOUNTER — Encounter: Payer: Self-pay | Admitting: Family Medicine

## 2021-05-05 VITALS — BP 110/68 | HR 56 | Temp 97.5°F | Resp 14 | Ht 67.0 in | Wt 183.6 lb

## 2021-05-05 DIAGNOSIS — Z6828 Body mass index (BMI) 28.0-28.9, adult: Secondary | ICD-10-CM

## 2021-05-05 DIAGNOSIS — Z1211 Encounter for screening for malignant neoplasm of colon: Secondary | ICD-10-CM

## 2021-05-05 DIAGNOSIS — Z125 Encounter for screening for malignant neoplasm of prostate: Secondary | ICD-10-CM

## 2021-05-05 DIAGNOSIS — Z23 Encounter for immunization: Secondary | ICD-10-CM

## 2021-05-05 DIAGNOSIS — D229 Melanocytic nevi, unspecified: Secondary | ICD-10-CM

## 2021-05-05 DIAGNOSIS — Z0001 Encounter for general adult medical examination with abnormal findings: Secondary | ICD-10-CM | POA: Diagnosis not present

## 2021-05-05 NOTE — Progress Notes (Signed)
Subjective:  Patient ID: Robert Arroyo, male    DOB: Feb 21, 1961  Age: 60 y.o. MRN: [id number]  Chief Complaint  Patient presents with   Annual Exam    HPI  Well Adult Physical: Patient here for a comprehensive physical exam.The patient reports no problems Do you take any herbs or supplements that were not prescribed by a doctor? no Are you taking calcium supplements? no Are you taking aspirin daily? no  Encounter for general adult medical examination without abnormal findings  Physical ("At Risk" items are starred): Patient's last physical exam was 1 year ago .  Smoking: Life-long non-smoker ;  Physical Activity: Exercises at least 3 times per week ;  Alcohol/Drug Use: Is a non-drinker ; No illicit drug use ;  Patient is not afflicted from Stress Incontinence and Urge Incontinence  Safety: reviewed ; Patient wears a seat belt, has smoke detectors, has carbon monoxide detectors, practices appropriate gun safety, and wears sunscreen with extended sun exposure. Dental Care: biannual cleanings, brushes and flosses daily. Ophthalmology/Optometry: Annual visit. Due. Hearing loss: none Vision impairments: wears glasses Last PSA:  [place] Office Visit from 05/05/2021 in [facility]  PHQ-2 Total Score 0       Current Exercise Habits: Home exercise routine, Time (Minutes): 20, Frequency (Times/Week): 5, Weekly Exercise (Minutes/Week): 100       Social History   Socioeconomic History   Marital status: Married    Spouse name: Not on file   Number of children: 0   Years of education: Not on file   Highest education level: Not on file  Occupational History   Not on file  Tobacco Use   Smoking status: Never   Smokeless tobacco: Never  Vaping Use   Vaping Use: Never used  Substance and Sexual Activity   Alcohol use: Yes    Comment: red wine rarely   Drug use: Never   Sexual activity: Not on file  Other Topics Concern   Not on file  Social History  Narrative   Not on file   Social Determinants of Health   Financial Resource Strain: Not on file  Food Insecurity: Not on file  Transportation Needs: Not on file  Physical Activity: Not on file  Stress: Not on file  Social Connections: Not on file   Past Medical History:  Diagnosis Date   A-fib [facility]) 2003   Closed fracture of multiple pubic rami ([place]) 01/2020   Hyperlipidemia    Hypoxia    Pneumonia due to COVID-19 virus 11/06/2019   Right inguinal hernia    Past Surgical History:  Procedure Laterality Date   CARDIAC CATHETERIZATION  2003    Family History  Problem Relation Age of Onset   Leukemia Mother    Glaucoma Father    Social History   Socioeconomic History   Marital status: Married    Spouse name: Not on file   Number of children: 0   Years of education: Not on file   Highest education level: Not on file  Occupational History   Not on file  Tobacco Use   Smoking status: Never   Smokeless tobacco: Never  Vaping Use   Vaping Use: Never used  Substance and Sexual Activity   Alcohol use: Yes    Comment: red wine rarely   Drug use: Never   Sexual activity: Not on file  Other Topics Concern   Not on file  Social History Narrative   Not on file   Social  Determinants of Health   Financial Resource Strain: Not on file  Food Insecurity: Not on file  Transportation Needs: Not on file  Physical Activity: Not on file  Stress: Not on file  Social Connections: Not on file   Review of Systems  Constitutional:  Negative for chills, fatigue and fever.  HENT:  Negative for congestion, ear pain and sore throat.   Respiratory:  Negative for cough and shortness of breath.   Cardiovascular:  Negative for chest pain.  Gastrointestinal:  Negative for abdominal pain, constipation, diarrhea, nausea and vomiting.  Endocrine: Negative for polydipsia, polyphagia and polyuria.  Genitourinary:  Negative for dysuria and frequency.  Musculoskeletal:  Negative for  arthralgias and myalgias.  Neurological:  Negative for dizziness and headaches.  Psychiatric/Behavioral:  Negative for dysphoric mood. The patient is not nervous/anxious.        No dysphoria    Objective:  BP 110/68   Pulse (!) 56   Temp (!) 97.5 F (36.4 C)   Resp 14   Ht '5\' 7"'$  (1.702 m)   Wt 183 lb 9.6 oz (83.3 kg)   BMI 28.76 kg/m   BP/Weight 05/05/2021 01/25/2021 [id number]  Systolic BP [id number] 0000000 A999333  Diastolic BP 68 86 60  Wt. (Lbs) 183.6 186 190  BMI 28.76 29.13 29.76    Physical Exam Vitals reviewed.  Constitutional:      Appearance: Normal appearance.  HENT:     Right Ear: Tympanic membrane, ear canal and external ear normal.     Left Ear: Tympanic membrane, ear canal and external ear normal.     Nose: Nose normal. No congestion or rhinorrhea.     Mouth/Throat:     Mouth: Mucous membranes are moist.     Pharynx: No oropharyngeal exudate or posterior oropharyngeal erythema.  Neck:     Vascular: No carotid bruit.  Cardiovascular:     Rate and Rhythm: Normal rate and regular rhythm.     Pulses: Normal pulses.     Heart sounds: Normal heart sounds.  Pulmonary:     Effort: Pulmonary effort is normal. No respiratory distress.     Breath sounds: Normal breath sounds. No wheezing, rhonchi or rales.  Abdominal:     General: Bowel sounds are normal.     Palpations: Abdomen is soft.     Tenderness: There is no abdominal tenderness.  Lymphadenopathy:     Cervical: No cervical adenopathy.  Skin:    Findings: Lesion (rt lower spine: crescent shaped pigmented lesion.) present.  Neurological:     Mental Status: He is alert and oriented to person, place, and time.  Psychiatric:        Mood and Affect: Mood normal.        Behavior: Behavior normal.    Lab Results  Component Value Date   WBC 6.4 05/05/2021   HGB 15.4 05/05/2021   HCT 46.7 05/05/2021   PLT 213 05/05/2021   GLUCOSE 101 (H) 05/05/2021   CHOL 152 05/05/2021   TRIG 115 05/05/2021   HDL 51 05/05/2021    LDLCALC 80 05/05/2021   ALT 25 05/05/2021   AST 23 05/05/2021   NA 141 05/05/2021   K 4.8 05/05/2021   CL 103 05/05/2021   CREATININE 1.19 05/05/2021   BUN 14 05/05/2021   CO2 22 05/05/2021   TSH 1.080 05/05/2021   INR 1.2 01/25/2020   HGBA1C 5.5 10/25/2020      Assessment & Plan:   Problem List Items Addressed This Visit  Other   Abnormal physical evaluation - Primary    Healthy male.  Recommend continue to work on eating healthy diet and exercise.       Relevant Orders   PSA (Completed)   CBC with Differential/Platelet (Completed)   Comprehensive metabolic panel (Completed)   Lipid panel (Completed)   TSH (Completed)   BMI 28.0-28.9,adult   Atypical mole    Refer to dermatology      Prostate cancer screening    Check psa      Relevant Orders   PSA (Completed)   Screening for colon cancer    Refer to gi for colonoscopy.       Relevant Orders   Ambulatory referral to Gastroenterology   Other Visit Diagnoses     Need for influenza vaccination       Relevant Orders   Flu Vaccine MDCK QUAD PF (Completed)         Body mass index is 28.76 kg/m.   This is a list of the screening recommended for you and due dates:  Health Maintenance  Topic Date Due   Hepatitis C Screening: USPSTF Recommendation to screen - Ages 80-79 yo.  Never done   Colon Cancer Screening  09/30/2020   COVID-19 Vaccine (4 - Booster for Pfizer series) 01/17/2021   Tetanus Vaccine  01/24/2030   Flu Shot  Completed   HIV Screening  Completed   Zoster (Shingles) Vaccine  Completed   Pneumococcal Vaccination  Aged Out   HPV Vaccine  Aged Out     Follow-up: Return in about 6 months (around 11/02/2021) for chronic fasting.  An After Visit Summary was printed and given to the patient.  Rochel Brome, MD [facility] [phone]

## 2021-05-05 NOTE — Patient Instructions (Signed)
Preventive Care 40-61 Years Old, Male Preventive care refers to lifestyle choices and visits with your health care provider that can promote health and wellness. This includes: A yearly physical exam. This is also called an annual wellness visit. Regular dental and eye exams. Immunizations. Screening for certain conditions. Healthy lifestyle choices, such as: Eating a healthy diet. Getting regular exercise. Not using drugs or products that contain nicotine and tobacco. Limiting alcohol use. What can I expect for my preventive care visit? Physical exam Your health care provider will check your: Height and weight. These may be used to calculate your BMI (body mass index). BMI is a measurement that tells if you are at a healthy weight. Heart rate and blood pressure. Body temperature. Skin for abnormal spots. Counseling Your health care provider may ask you questions about your: Past medical problems. Family's medical history. Alcohol, tobacco, and drug use. Emotional well-being. Home life and relationship well-being. Sexual activity. Diet, exercise, and sleep habits. Work and work environment. Access to firearms. What immunizations do I need? Vaccines are usually given at various ages, according to a schedule. Your health care provider will recommend vaccines for you based on your age, medical history, and lifestyle or other factors, such as travel or where you work. What tests do I need? Blood tests Lipid and cholesterol levels. These may be checked every 5 years, or more often if you are over 50 years old. Hepatitis C test. Hepatitis B test. Screening Lung cancer screening. You may have this screening every year starting at age 55 if you have a 30-pack-year history of smoking and currently smoke or have quit within the past 15 years. Prostate cancer screening. Recommendations will vary depending on your family history and other risks. Genital exam to check for testicular cancer  or hernias. Colorectal cancer screening. All adults should have this screening starting at age 50 and continuing until age 75. Your health care provider may recommend screening at age 45 if you are at increased risk. You will have tests every 1-10 years, depending on your results and the type of screening test. Diabetes screening. This is done by checking your blood sugar (glucose) after you have not eaten for a while (fasting). You may have this done every 1-3 years. STD (sexually transmitted disease) testing, if you are at risk. Follow these instructions at home: Eating and drinking  Eat a diet that includes fresh fruits and vegetables, whole grains, lean protein, and low-fat dairy products. Take vitamin and mineral supplements as recommended by your health care provider. Do not drink alcohol if your health care provider tells you not to drink. If you drink alcohol: Limit how much you have to 0-2 drinks a day. Be aware of how much alcohol is in your drink. In the [place] one drink equals one 12 oz bottle of beer (355 mL), one 5 oz glass of wine (148 mL), or one 1 oz glass of hard liquor (44 mL). Lifestyle Take daily care of your teeth and gums. Brush your teeth every morning and night with fluoride toothpaste. Floss one time each day. Stay active. Exercise for at least 30 minutes 5 or more days each week. Do not use any products that contain nicotine or tobacco, such as cigarettes, e-cigarettes, and chewing tobacco. If you need help quitting, ask your health care provider. Do not use drugs. If you are sexually active, practice safe sex. Use a condom or other form of protection to prevent STIs (sexually transmitted infections). If told by your   health care provider, take low-dose aspirin daily starting at age 14. Find healthy ways to cope with stress, such as: Meditation, yoga, or listening to music. Journaling. Talking to a trusted person. Spending time with friends and  family. Safety Always wear your seat belt while driving or riding in a vehicle. Do not drive: If you have been drinking alcohol. Do not ride with someone who has been drinking. When you are tired or distracted. While texting. Wear a helmet and other protective equipment during sports activities. If you have firearms in your house, make sure you follow all gun safety procedures. What's next? Go to your health care provider once a year for an annual wellness visit. Ask your health care provider how often you should have your eyes and teeth checked. Stay up to date on all vaccines. This information is not intended to replace advice given to you by your health care provider. Make sure you discuss any questions you have with your health care provider. Document Revised: 10/22/2020 Document Reviewed: 08/08/2018 Elsevier Patient Education  2022 [place].  Mole A mole is a colored (pigmented) growth on the skin. Moles are very common. They are usually harmless, but some moles can become cancerous over time. What are the causes? Moles are caused when pigmented skin cells grow together in clusters instead of spreading out in the skin as they normally do. The reason why the skin cells grow together in clusters is not known. What increases the risk? You are more likely to develop a mole if you: Have family members who have moles. Are white. Have blond hair. Are often outdoors and exposed to the sun. Received phototherapy when you were a newborn baby. Are male. What are the signs or symptoms? A mole may be: [place] or black. Flat or raised. Smooth or wrinkled. How is this diagnosed? A mole is diagnosed with a skin exam. If your health care provider thinks a mole may be cancerous, all or part of the mole will be removed for testing (biopsy). How is this treated? Most moles are noncancerous (benign) and do not require treatment. If a mole is found to be cancerous, it will be removed. You may  also choose to have a mole removed if it is causing pain or if you do not like the way it looks. Follow these instructions at home: General instructions  Every month, look for new moles and check your existing moles for changes. This is important because a change in a mole can mean that the mole has become cancerous. ABCDE changes in a mole indicate that you should be evaluated by your health care provider. ABCDE stands for: Asymmetry. This means the mole has an irregular shape. It is not round or oval. Border. This means the mole has an irregular or bumpy border. Color. This means the mole has multiple colors in it, including brown, black, blue, red, or tan. Note that it is normal for moles to get darker when a woman is pregnant or takes birth control pills. Diameter. This means the mole is more than 0.2 inches (6 mm) across. Evolving. This refers to any unusual changes or symptoms in the mole, such as pain, itching, stinging, sensitivity, or bleeding. If you have a large number of moles, see a skin doctor (dermatologist) at least one time every year for a full-body skin check. Lifestyle  When you are outdoors, wear sunscreen with SPF 30 (sun protection factor 30) or higher. Use an adequate amount of sunscreen to cover  exposed areas of skin. Put it on 30 minutes before you go out. Reapply it every 2 hours or anytime you come out of the water. When you are out in the sun, wear a broad-brimmed hat and clothing that covers your arms and legs. Wear wraparound sunglasses. Contact a health care provider if: The size, shape, borders, or color of your mole changes. Your mole, or the skin near the mole, becomes painful, sore, red, or swollen. Your mole: Develops more than one color. Itches or bleeds. Becomes scaly, sheds skin, or oozes fluid. Becomes flat or develops raised areas. Becomes hard or soft. You develop a new mole. Summary A mole is a colored (pigmented) growth on the skin. Moles are  very common. They are usually harmless, but some moles can become cancerous over time. Every month, look for new moles and check your existing moles for changes. This is important because a change in a mole can mean that the mole has become cancerous. If you have a large number of moles, see a skin doctor (dermatologist) at least one time every year for a full-body skin check. When you are outdoors, wear sunscreen with SPF 30 (sun protection factor 30) or higher. Reapply it every 2 hours or anytime you come out of the water. Contact a health care provider if you notice changes in a mole or if you develop a new mole. This information is not intended to replace advice given to you by your health care provider. Make sure you discuss any questions you have with your health care provider. Document Revised: 03/20/2019 Document Reviewed: 01/08/2018 Elsevier Patient Education  [place].

## 2021-05-06 LAB — CBC WITH DIFFERENTIAL/PLATELET
Basophils Absolute: 0.1 10*3/uL (ref 0.0–0.2)
Basos: 1 %
EOS (ABSOLUTE): 0.3 10*3/uL (ref 0.0–0.4)
Eos: 4 %
Hematocrit: 46.7 % (ref 37.5–51.0)
Hemoglobin: 15.4 g/dL (ref 13.0–17.7)
Immature Grans (Abs): 0 10*3/uL (ref 0.0–0.1)
Immature Granulocytes: 1 %
Lymphocytes Absolute: 1.5 10*3/uL (ref 0.7–3.1)
Lymphs: 23 %
MCH: 29.2 pg (ref 26.6–33.0)
MCHC: 33 g/dL (ref 31.5–35.7)
MCV: 89 fL (ref 79–97)
Monocytes Absolute: 0.5 10*3/uL (ref 0.1–0.9)
Monocytes: 9 %
Neutrophils Absolute: 4 10*3/uL (ref 1.4–7.0)
Neutrophils: 62 %
Platelets: 213 10*3/uL (ref 150–450)
RBC: 5.27 x10E6/uL (ref 4.14–5.80)
RDW: 12.8 % (ref 11.6–15.4)
WBC: 6.4 10*3/uL (ref 3.4–10.8)

## 2021-05-06 LAB — LIPID PANEL
Chol/HDL Ratio: 3 ratio (ref 0.0–5.0)
Cholesterol, Total: 152 mg/dL (ref 100–199)
HDL: 51 mg/dL (ref >39)
LDL Chol Calc (NIH): 80 mg/dL (ref 0–99)
Triglycerides: 115 mg/dL (ref 0–149)
VLDL Cholesterol Cal: 21 mg/dL (ref 5–40)

## 2021-05-06 LAB — COMPREHENSIVE METABOLIC PANEL
ALT: 25 IU/L (ref 0–44)
AST: 23 IU/L (ref 0–40)
Albumin/Globulin Ratio: 2.1 (ref 1.2–2.2)
Albumin: 4.5 g/dL (ref 3.8–4.8)
Alkaline Phosphatase: 98 IU/L (ref 44–121)
BUN/Creatinine Ratio: 12 (ref 10–24)
BUN: 14 mg/dL (ref 8–27)
Bilirubin Total: 1.1 mg/dL (ref 0.0–1.2)
CO2: 22 mmol/L (ref 20–29)
Calcium: 9.3 mg/dL (ref 8.6–10.2)
Chloride: 103 mmol/L (ref 96–106)
Creatinine, Ser: 1.19 mg/dL (ref 0.76–1.27)
Globulin, Total: 2.1 g/dL (ref 1.5–4.5)
Glucose: 101 mg/dL — ABNORMAL HIGH (ref 65–99)
Potassium: 4.8 mmol/L (ref 3.5–5.2)
Sodium: 141 mmol/L (ref 134–144)
Total Protein: 6.6 g/dL (ref 6.0–8.5)
eGFR: 69 mL/min/{1.73_m2} (ref >59)

## 2021-05-06 LAB — PSA: Prostate Specific Ag, Serum: 2.4 ng/mL (ref 0.0–4.0)

## 2021-05-06 LAB — TSH: TSH: 1.08 u[IU]/mL (ref 0.450–4.500)

## 2021-05-08 DIAGNOSIS — Z0001 Encounter for general adult medical examination with abnormal findings: Secondary | ICD-10-CM | POA: Insufficient documentation

## 2021-05-08 DIAGNOSIS — Z125 Encounter for screening for malignant neoplasm of prostate: Secondary | ICD-10-CM | POA: Insufficient documentation

## 2021-05-08 DIAGNOSIS — E663 Overweight: Secondary | ICD-10-CM | POA: Insufficient documentation

## 2021-05-08 DIAGNOSIS — Z6828 Body mass index (BMI) 28.0-28.9, adult: Secondary | ICD-10-CM | POA: Insufficient documentation

## 2021-05-08 DIAGNOSIS — D229 Melanocytic nevi, unspecified: Secondary | ICD-10-CM | POA: Insufficient documentation

## 2021-05-08 DIAGNOSIS — Z1211 Encounter for screening for malignant neoplasm of colon: Secondary | ICD-10-CM | POA: Insufficient documentation

## 2021-05-08 NOTE — Assessment & Plan Note (Signed)
Check psa 

## 2021-05-08 NOTE — Assessment & Plan Note (Signed)
Refer to gi for colonoscopy.

## 2021-05-08 NOTE — Assessment & Plan Note (Signed)
Healthy male.  Recommend continue to work on eating healthy diet and exercise.

## 2021-05-08 NOTE — Assessment & Plan Note (Signed)
Refer to dermatology 

## 2021-05-11 ENCOUNTER — Other Ambulatory Visit: Payer: Self-pay | Admitting: Family Medicine

## 2021-05-11 DIAGNOSIS — E782 Mixed hyperlipidemia: Secondary | ICD-10-CM

## 2021-06-09 ENCOUNTER — Ambulatory Visit: Payer: Managed Care, Other (non HMO) | Admitting: Family Medicine

## 2021-06-12 IMAGING — CT CT ANGIO NECK
3 of 6 series · 8 of 16 positions shown · IV contrast (APPLIED)
Comparison: None.

CLINICAL DATA: Fell from [facility] today.  Neck trauma blunt.

EXAM:
CT ANGIOGRAPHY NECK
TECHNIQUE: Multidetector CT imaging of the neck was performed using the
standard protocol during bolus administration of intravenous
contrast. Multiplanar CT image reconstructions and MIPs were
obtained to evaluate the vascular anatomy. Carotid stenosis
measurements (when applicable) are obtained utilizing NASCET
criteria, using the distal internal carotid diameter as the
denominator.
CONTRAST:  100mL OMNIPAQUE IOHEXOL 350 MG/ML SOLN

[Series 6: cta neck 2.0 i30f 3 · axial · 0.46mm/px · z∈[-403,-145]mm · 2 of 130 slices shown]
[im 1/130  soft-tissue]
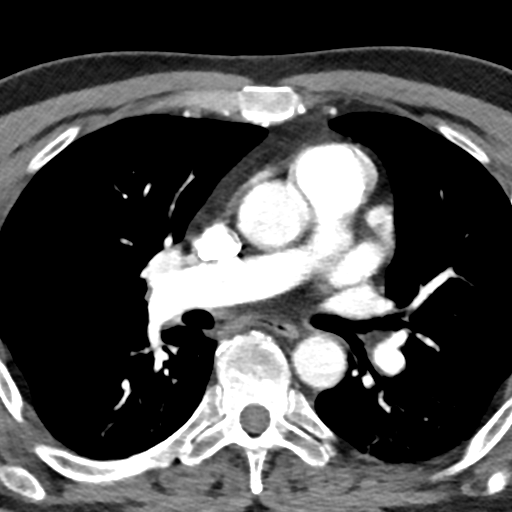
[im 130/130  bone]
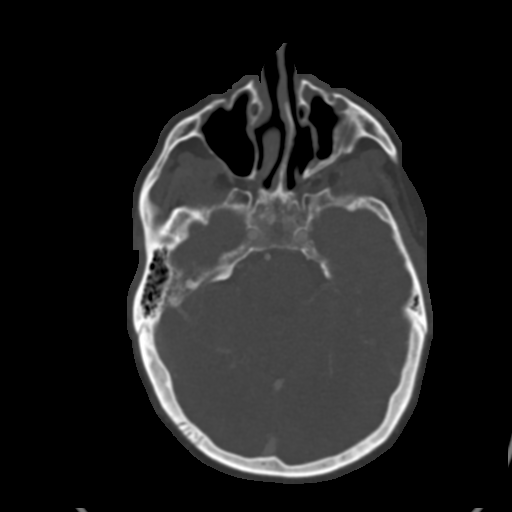

[Series 8: coronal thin · coronal · 0.43mm/px · 3 of 267 slices shown]
[im 54/267  soft-tissue]
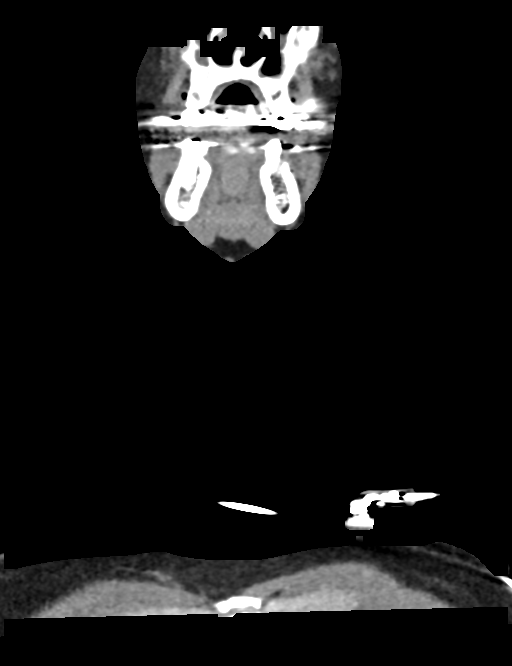
[im 107/267  soft-tissue]
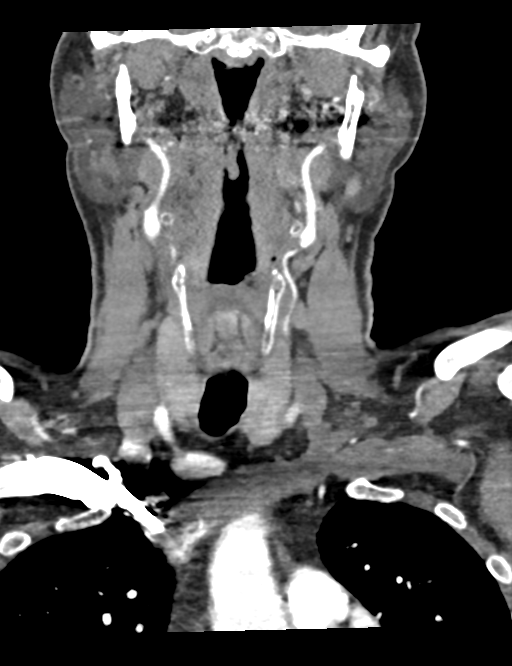
[im 160/267  soft-tissue]
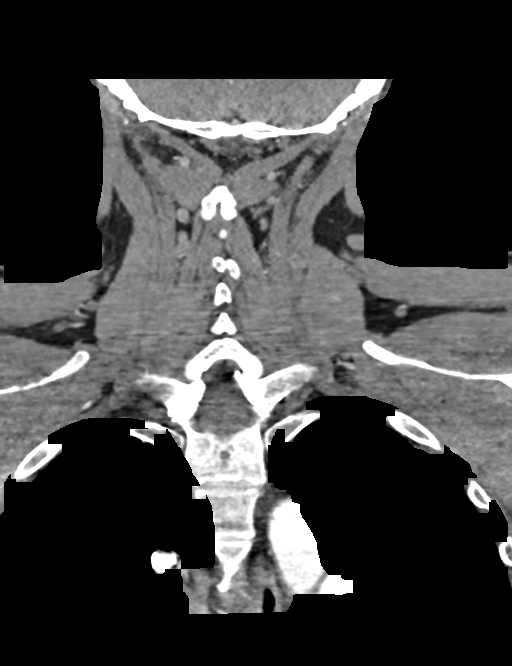

[Series 10: sagittal thin · sagittal · 0.49mm/px · 3 of 216 slices shown]
[im 54/216  soft-tissue]
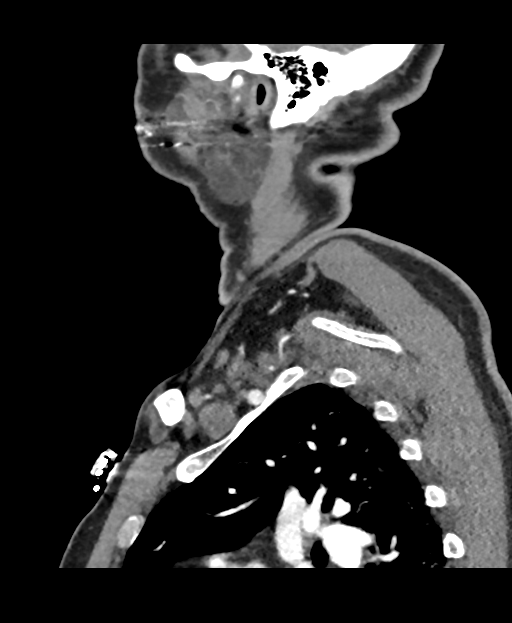
[im 108/216  soft-tissue]
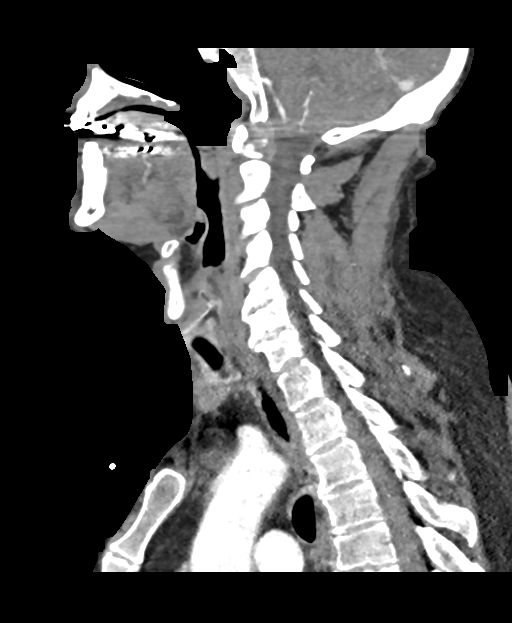
[im 162/216  soft-tissue]
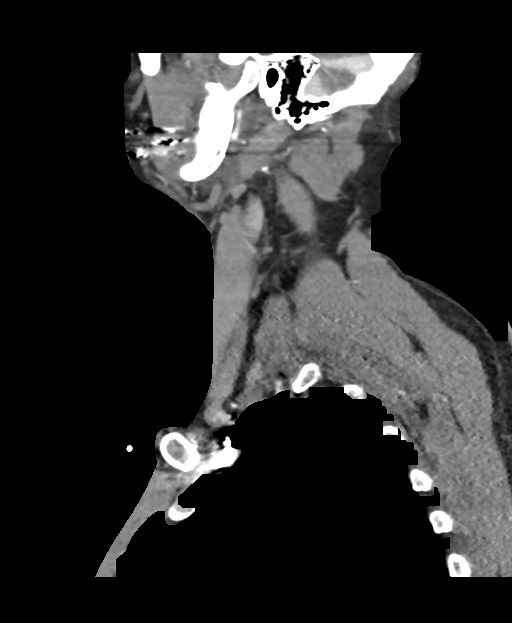

[8 of 16 positions shown; findings below may reference images not displayed]

FINDINGS: Aortic arch: Standard branching. Imaged portion shows no evidence of
aneurysm or dissection. No significant stenosis of the major arch
vessel origins.

Right carotid system: Normal right carotid. No atherosclerotic
disease dissection or irregularity.

Left carotid system: Normal left carotid. No atherosclerotic
disease, dissection, or irregularity

Vertebral arteries: Normal vertebral arteries bilaterally. No injury
or stenosis.

Skeleton: Cervical spondylosis most prominent at C4-5 and C5-6.

Other neck: Negative for soft tissue mass or hematoma in the neck.

Upper chest: Mild dependent atelectasis bilaterally. No effusion or
pneumothorax.
IMPRESSION: 1. Negative for carotid or vertebral artery injury in the neck. No
significant stenosis.
2. Negative for soft tissue mass or hematoma in the neck.

## 2021-06-12 IMAGING — CT CT CERVICAL SPINE W/O CM
3 of 4 series · 13 of 33 positions shown, 16 images · non-contrast
Comparison: None.

CLINICAL DATA: Fall from [facility].

EXAM:
CT HEAD WITHOUT CONTRAST
CT CERVICAL SPINE WITHOUT CONTRAST
TECHNIQUE: Multidetector CT imaging of the head and cervical spine was
performed following the standard protocol without intravenous
contrast. Multiplanar CT image reconstructions of the cervical spine
were also generated.

[Series 5: c_spine 2.0 st · axial · 0.40mm/px · z∈[-312,-180]mm · 5 of 100 slices shown, 7 images]
[im 17/100  soft-tissue]
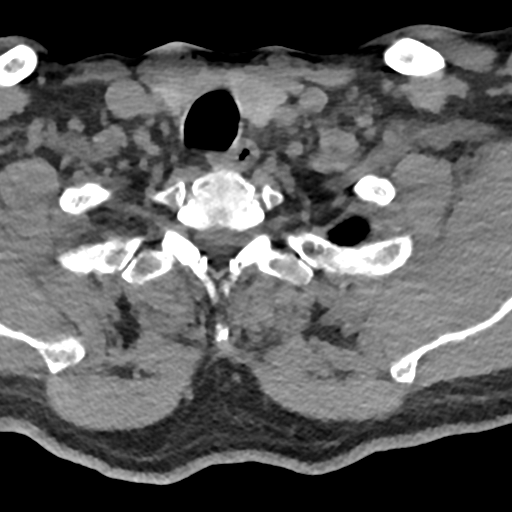
[im 17/100  bone]
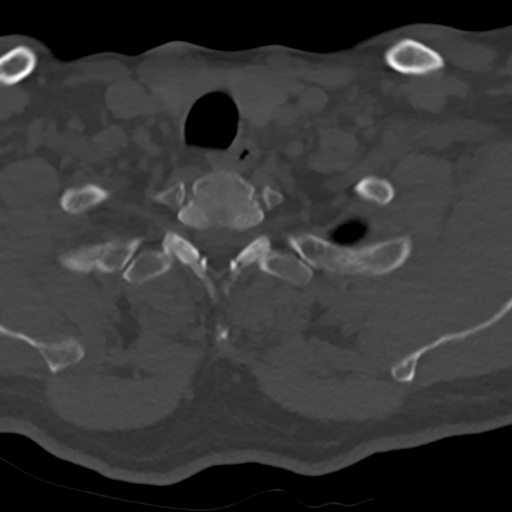
[im 34/100  bone]
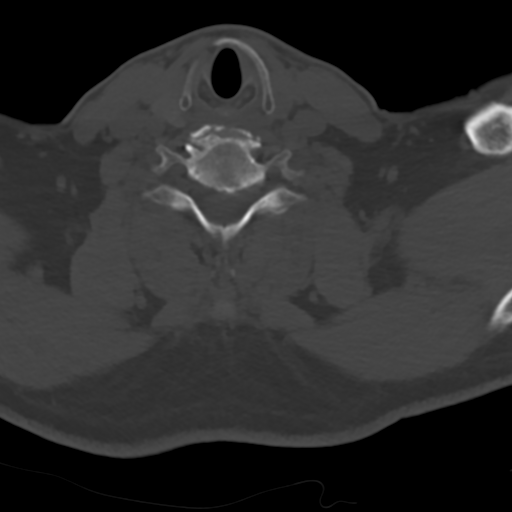
[im 50/100  bone]
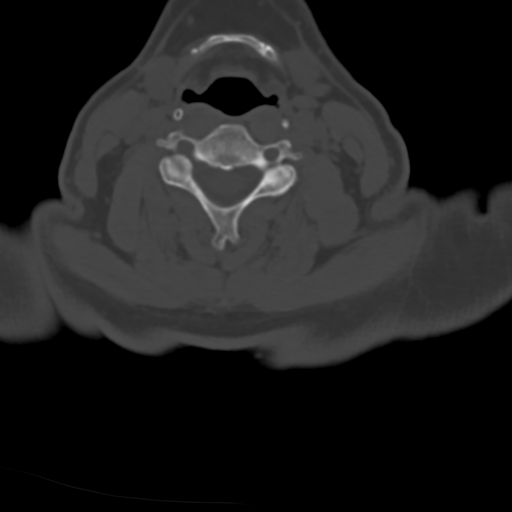
[im 67/100  bone]
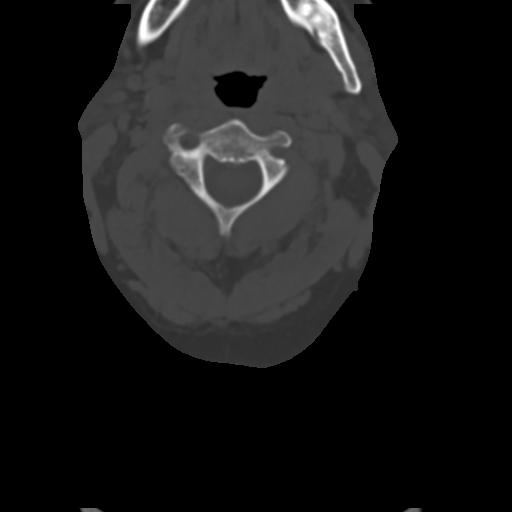
[im 83/100  soft-tissue]
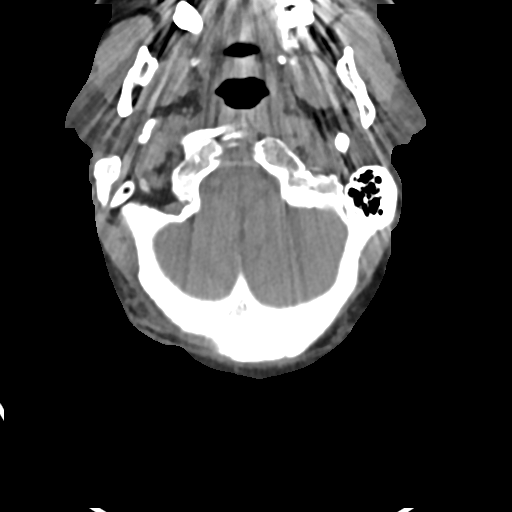
[im 83/100  bone]
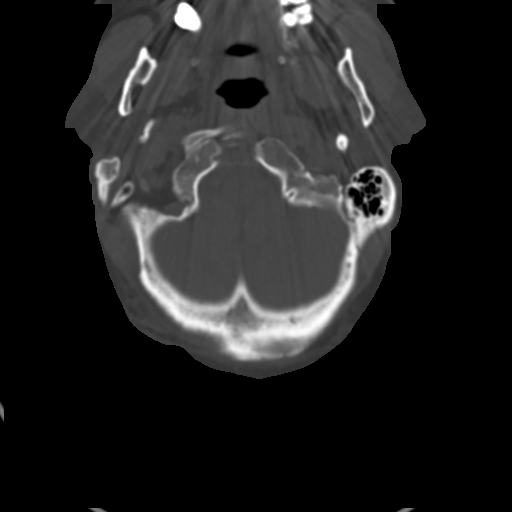

[Series 8: coronal bone · coronal · 0.26mm/px · 3 of 64 slices shown]
[im 13/64  bone]
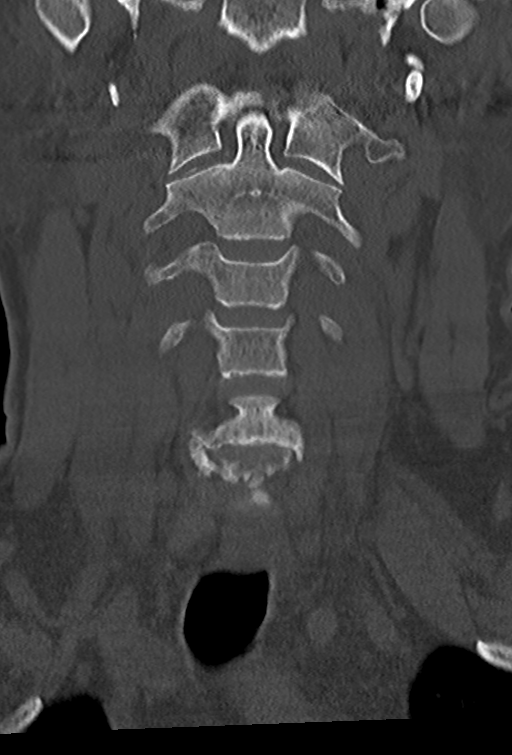
[im 26/64  bone]
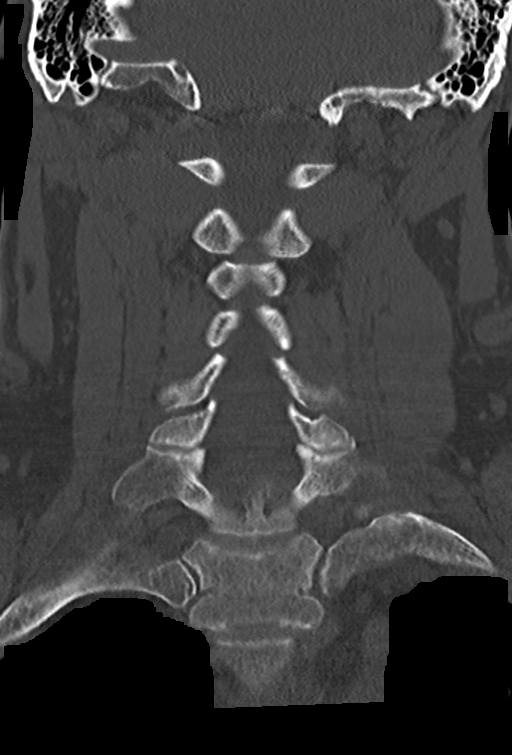
[im 38/64  bone]
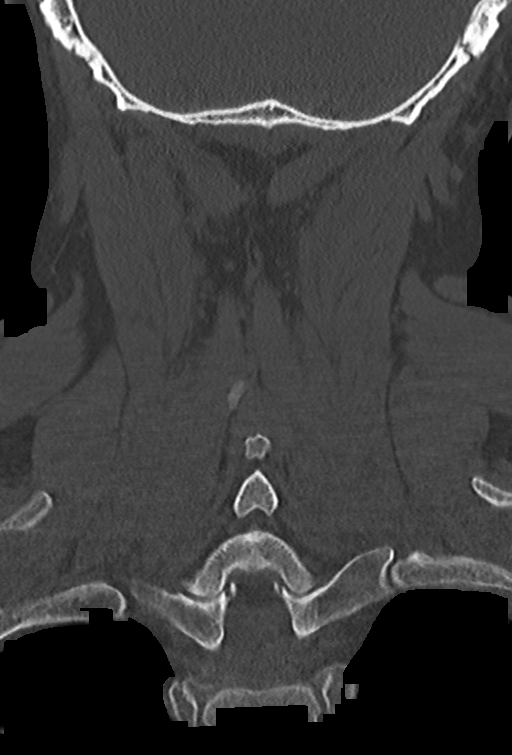

[Series 9: sagittal bone · sagittal · 0.28mm/px · 5 of 67 slices shown, 6 images]
[im 23/67  bone]
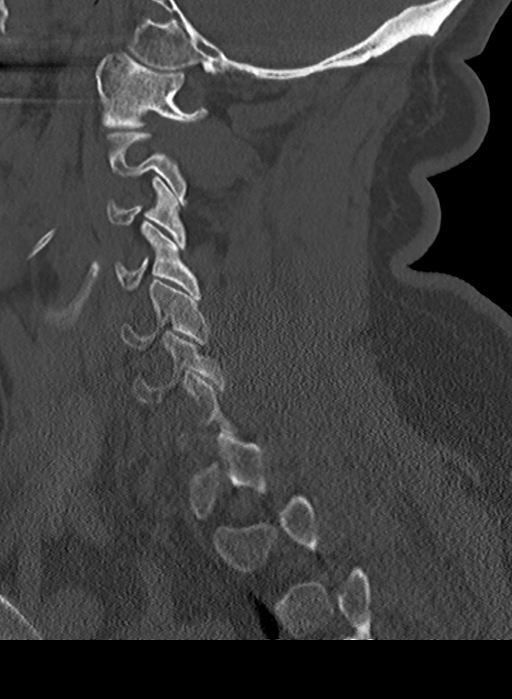
[im 28/67  bone]
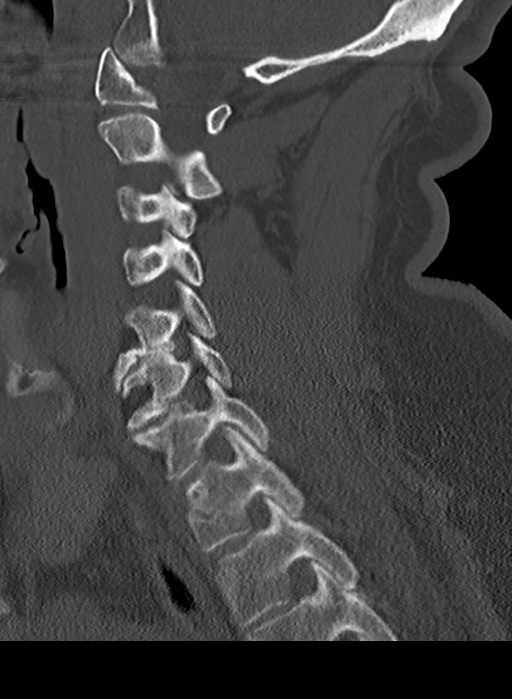
[im 34/67  soft-tissue]
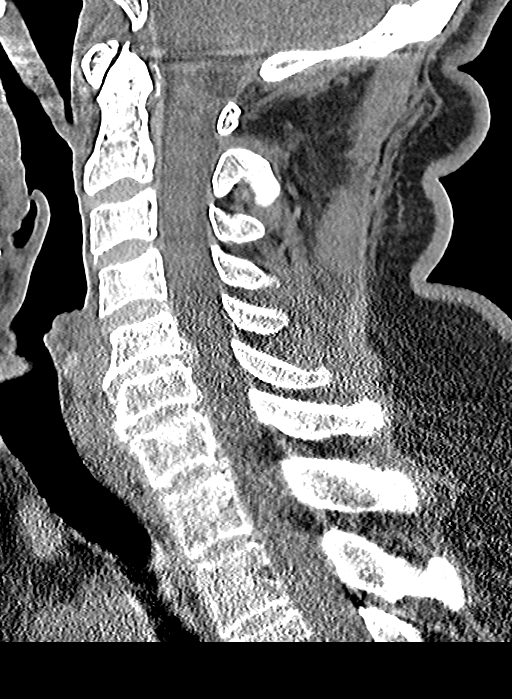
[im 34/67  bone]
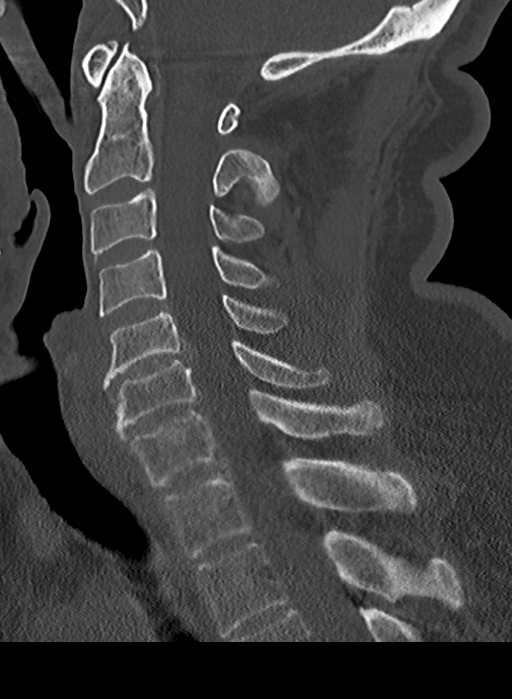
[im 39/67  bone]
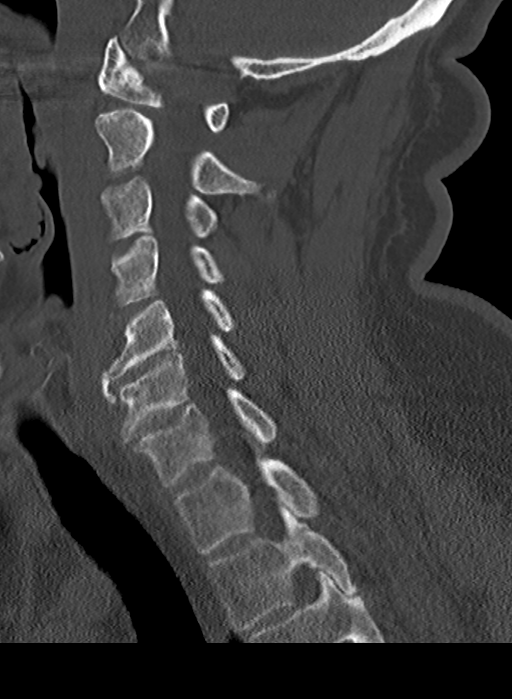
[im 45/67  bone]
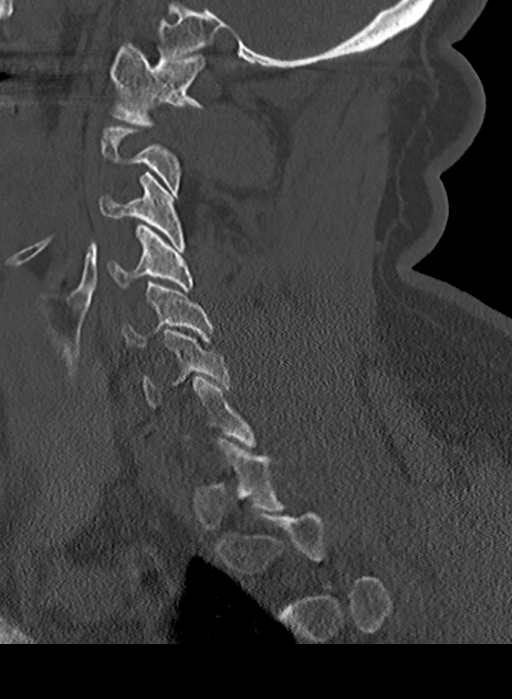

[13 of 33 positions shown; findings below may reference images not displayed]

FINDINGS: CT HEAD FINDINGS

Brain: No acute intracranial hemorrhage. No focal mass lesion. No CT
evidence of acute infarction. No midline shift or mass effect. No
hydrocephalus. Basilar cisterns are patent.

Vascular: No hyperdense vessel or unexpected calcification.

Skull: Normal. Negative for fracture or focal lesion.

Sinuses/Orbits: Paranasal sinuses and mastoid air cells are clear.
Orbits are clear.

Other: None.

CT CERVICAL SPINE FINDINGS

Alignment: Normal alignment of the cervical vertebral bodies.

Skull base and vertebrae: Normal craniocervical junction. No loss of
vertebral body height or disc height. Normal facet articulation. No
evidence of fracture.

Soft tissues and spinal canal: No prevertebral soft tissue swelling.
No perispinal or epidural hematoma.

Disc levels: There is endplate spurring from C5-T1. No subluxation.

Upper chest: Clear

Other: None
IMPRESSION: 1. No intracranial trauma.
2. No cervical spine fracture.

## 2021-11-03 ENCOUNTER — Ambulatory Visit: Payer: Managed Care, Other (non HMO) | Admitting: Family Medicine

## 2021-11-03 ENCOUNTER — Ambulatory Visit (INDEPENDENT_AMBULATORY_CARE_PROVIDER_SITE_OTHER): Payer: Managed Care, Other (non HMO)

## 2021-11-03 ENCOUNTER — Encounter: Payer: Self-pay | Admitting: Family Medicine

## 2021-11-03 ENCOUNTER — Other Ambulatory Visit: Payer: Self-pay

## 2021-11-03 VITALS — BP 120/68 | HR 56 | Temp 96.3°F | Resp 16 | Ht 65.5 in | Wt 178.0 lb

## 2021-11-03 DIAGNOSIS — E663 Overweight: Secondary | ICD-10-CM

## 2021-11-03 DIAGNOSIS — M25511 Pain in right shoulder: Secondary | ICD-10-CM

## 2021-11-03 DIAGNOSIS — Z6829 Body mass index (BMI) 29.0-29.9, adult: Secondary | ICD-10-CM

## 2021-11-03 DIAGNOSIS — E782 Mixed hyperlipidemia: Secondary | ICD-10-CM | POA: Insufficient documentation

## 2021-11-03 DIAGNOSIS — I471 Supraventricular tachycardia: Secondary | ICD-10-CM | POA: Diagnosis not present

## 2021-11-03 DIAGNOSIS — Z23 Encounter for immunization: Secondary | ICD-10-CM | POA: Diagnosis not present

## 2021-11-03 DIAGNOSIS — I4719 Other supraventricular tachycardia: Secondary | ICD-10-CM | POA: Insufficient documentation

## 2021-11-03 MED ORDER — IBUPROFEN 800 MG PO TABS: 800.0000 mg | ORAL_TABLET | Freq: Three times a day (TID) | ORAL | 0 refills | Status: AC | PRN

## 2021-11-03 MED ORDER — SILDENAFIL CITRATE 100 MG PO TABS: ORAL_TABLET | ORAL | 3 refills | Status: DC

## 2021-11-03 NOTE — Assessment & Plan Note (Signed)
Ibuprofen 800 three times a day  ?Education and exercises given.  ?Xray if not improved in 2 weeks.  ?

## 2021-11-03 NOTE — Assessment & Plan Note (Signed)
Well controlled.  No changes to medicines. Continue crestor 20 mg daily Continue to work on eating a healthy diet and exercise.  Labs drawn today.   

## 2021-11-03 NOTE — Progress Notes (Signed)
? ?Subjective:  ?Patient ID: Robert Arroyo, male    DOB: 1961-02-10  Age: 61 y.o. MRN: [id number] ? ?Chief Complaint  ?Patient presents with  ? Hyperlipidemia  ? ?HPI ?SVT: on metoprolol. No palpitations. ? ?Hyperlipidemia: ?Current medications: crestor 20 mg once before bed  ? ?Diet: eats fairly healthy.  ?Exercise: yes ? ?Right shoulder pain x 2 months. No known injury. Tried advil a little.  ? ?Current Outpatient Medications on File Prior to Visit  ?Medication Sig Dispense Refill  ? cetirizine (ZYRTEC) 10 MG tablet Take 10 mg by mouth daily as needed for allergies.    ? metoprolol succinate (TOPROL-XL) 25 MG 24 hr tablet TAKE 1 TABLET DAILY 90 tablet 3  ? Multiple Vitamin (MULTIVITAMIN) tablet Take 1 tablet by mouth daily.    ? rosuvastatin (CRESTOR) 20 MG tablet TAKE 1 TABLET DAILY 90 tablet 3  ? ?No current facility-administered medications on file prior to visit.  ? ?Past Medical History:  ?Diagnosis Date  ? A-fib [facility]) 2003  ? Closed fracture of multiple pubic rami ([place]) 01/2020  ? COVID-19   ? Hyperlipidemia   ? Hypoxia   ? Pneumonia due to COVID-19 virus 11/06/2019  ? Right inguinal hernia   ? ?Past Surgical History:  ?Procedure Laterality Date  ? CARDIAC CATHETERIZATION  2003  ?  ?Family History  ?Problem Relation Age of Onset  ? Leukemia Mother   ? Glaucoma Father   ? ?Social History  ? ?Socioeconomic History  ? Marital status: Married  ?  Spouse name: Not on file  ? Number of children: 0  ? Years of education: Not on file  ? Highest education level: Not on file  ?Occupational History  ? Not on file  ?Tobacco Use  ? Smoking status: Never  ? Smokeless tobacco: Never  ?Vaping Use  ? Vaping Use: Never used  ?Substance and Sexual Activity  ? Alcohol use: Yes  ?  Comment: red wine rarely  ? Drug use: Never  ? Sexual activity: Not on file  ?Other Topics Concern  ? Not on file  ?Social History Narrative  ? Not on file  ? ?Social Determinants of Health  ? ?Financial Resource Strain: Not on file  ?Food  Insecurity: Not on file  ?Transportation Needs: Not on file  ?Physical Activity: Not on file  ?Stress: Not on file  ?Social Connections: Not on file  ? ? ?Review of Systems  ?Constitutional:  Negative for chills and fever.  ?HENT:  Negative for congestion, ear pain, rhinorrhea and sore throat.   ?Respiratory:  Negative for cough and shortness of breath.   ?Cardiovascular:  Negative for chest pain and palpitations.  ?Gastrointestinal:  Negative for abdominal pain, constipation, diarrhea, nausea and vomiting.  ?Genitourinary:  Negative for dysuria and urgency.  ?Musculoskeletal:  Positive for arthralgias (right shoulder pain). Negative for back pain and myalgias.  ?Neurological:  Negative for dizziness and headaches.  ?Psychiatric/Behavioral:  Negative for dysphoric mood. The patient is not nervous/anxious.   ? ? ?Objective:  ?BP 120/68   Pulse (!) 56   Temp (!) 96.3 ?F (35.7 ?C)   Resp 16   Ht 5' 5.5" (1.664 m)   Wt 178 lb (80.7 kg)   BMI 29.17 kg/m?  ? ?BP/Weight 11/03/2021 05/05/2021 01/25/2021  ?Systolic BP 542 706 237  ?Diastolic BP 68 68 86  ?Wt. (Lbs) 178 183.6 186  ?BMI 29.17 28.76 29.13  ? ? ?Physical Exam ?Vitals reviewed.  ?Constitutional:   ?   Appearance:  Normal appearance.  ?Neck:  ?   Vascular: No carotid bruit.  ?Cardiovascular:  ?   Rate and Rhythm: Normal rate and regular rhythm.  ?   Pulses: Normal pulses.  ?   Heart sounds: Normal heart sounds.  ?Pulmonary:  ?   Effort: Pulmonary effort is normal.  ?   Breath sounds: Normal breath sounds. No wheezing, rhonchi or rales.  ?Abdominal:  ?   General: Bowel sounds are normal.  ?   Palpations: Abdomen is soft.  ?   Tenderness: There is no abdominal tenderness.  ?Musculoskeletal:  ?   Comments: RIGHT SHOULDER EXAM ?TENDER: anterior an dposterior. ?ABDUCTION: limited and elicits pain.  ?EXTERNAL ROTATION:  limited and elicits pain.  ?INTERNAL ROTATION:  limited and elicits pain.  ?EMPTY CAN SIGN: positive ? ? ?LEFT SHOULDER EXAM ?FROM NORMAL ?   ?Neurological:  ?   Mental Status: He is alert.  ?Psychiatric:     ?   Mood and Affect: Mood normal.     ?   Behavior: Behavior normal.  ? ? ?Diabetic Foot Exam - Simple   ?No data filed ?  ?  ? ?Lab Results  ?Component Value Date  ? WBC 6.4 05/05/2021  ? HGB 15.4 05/05/2021  ? HCT 46.7 05/05/2021  ? PLT 213 05/05/2021  ? GLUCOSE 101 (H) 05/05/2021  ? CHOL 152 05/05/2021  ? TRIG 115 05/05/2021  ? HDL 51 05/05/2021  ? [place] 80 05/05/2021  ? ALT 25 05/05/2021  ? AST 23 05/05/2021  ? NA 141 05/05/2021  ? K 4.8 05/05/2021  ? CL 103 05/05/2021  ? CREATININE 1.19 05/05/2021  ? BUN 14 05/05/2021  ? CO2 22 05/05/2021  ? TSH 1.080 05/05/2021  ? INR 1.2 01/25/2020  ? HGBA1C 5.5 10/25/2020  ? ? ? ? ?Assessment & Plan:  ? ?Problem List Items Addressed This Visit   ? ?  ? Cardiovascular and Mediastinum  ? PAT (paroxysmal atrial tachycardia) (Start)  ?  The current medical regimen is effective;  continue present plan and medications. ? ?  ?  ? Relevant Medications  ? sildenafil (VIAGRA) 100 MG tablet  ?  ? Other  ? Overweight with body mass index (BMI) of 29 to 29.9 in adult  ?  Recommend continue to work on eating healthy diet and exercise. ? ?  ?  ? Mixed hyperlipidemia - Primary  ?  Well controlled.  ?No changes to medicines. Continue crestor 20 mg daily.  ?Continue to work on eating a healthy diet and exercise.  ?Labs drawn today.  ? ?  ?  ? Relevant Medications  ? sildenafil (VIAGRA) 100 MG tablet  ? Other Relevant Orders  ? CBC with Differential/Platelet  ? Comprehensive metabolic panel  ? Lipid panel  ? Acute pain of right shoulder  ?  Ibuprofen 800 three times a day  ?Education and exercises given.  ?Xray if not improved in 2 weeks.  ?  ?  ? Relevant Orders  ? DG Shoulder Right  ?. ? ?Meds ordered this encounter  ?Medications  ? sildenafil (VIAGRA) 100 MG tablet  ?  Sig: One daily one hour prior to intercourse  ?  Dispense:  30 tablet  ?  Refill:  3  ? ibuprofen (ADVIL) 800 MG tablet  ?  Sig: Take 1 tablet (800 mg total)  by mouth every 8 (eight) hours as needed.  ?  Dispense:  90 tablet  ?  Refill:  0  ? ? ?Orders Placed  This Encounter  ?Procedures  ? DG Shoulder Right  ? CBC with Differential/Platelet  ? Comprehensive metabolic panel  ? Lipid panel  ?  ?Total time spent on today's visit was greater than 30 minutes, including both face-to-face time and nonface-to-face time personally spent on review of chart (labs and imaging), discussing labs and goals, discussing further work-up, treatment options, referrals to specialist if needed, reviewing outside records of pertinent, answering patient's questions, and coordinating care. ? ?Follow-up: Return in about 6 months (around 05/06/2022) for cpe fasting. ? ?An After Visit Summary was printed and given to the patient. ? ?Rochel Brome, MD ?[place] ?[phone] ?

## 2021-11-03 NOTE — Assessment & Plan Note (Signed)
Recommend continue to work on eating healthy diet and exercise.  

## 2021-11-03 NOTE — Assessment & Plan Note (Signed)
The current medical regimen is effective;  continue present plan and medications.  

## 2021-11-03 NOTE — Patient Instructions (Addendum)
Ibuprofen 800 mg three times a day for 2 weeks.  ? ?Get shoulder xray if no better in 2 weeks.  ?Return in 2 weeks for steroid injection in joint if no better.  ?

## 2021-11-04 LAB — COMPREHENSIVE METABOLIC PANEL
ALT: 35 IU/L (ref 0–44)
AST: 27 IU/L (ref 0–40)
Albumin/Globulin Ratio: 2 (ref 1.2–2.2)
Albumin: 4.7 g/dL (ref 3.8–4.8)
Alkaline Phosphatase: 94 IU/L (ref 44–121)
BUN/Creatinine Ratio: 9 — ABNORMAL LOW (ref 10–24)
BUN: 10 mg/dL (ref 8–27)
Bilirubin Total: 0.8 mg/dL (ref 0.0–1.2)
CO2: 24 mmol/L (ref 20–29)
Calcium: 9.9 mg/dL (ref 8.6–10.2)
Chloride: 102 mmol/L (ref 96–106)
Creatinine, Ser: 1.12 mg/dL (ref 0.76–1.27)
Globulin, Total: 2.3 g/dL (ref 1.5–4.5)
Glucose: 117 mg/dL — ABNORMAL HIGH (ref 70–99)
Potassium: 5.4 mmol/L — ABNORMAL HIGH (ref 3.5–5.2)
Sodium: 141 mmol/L (ref 134–144)
Total Protein: 7 g/dL (ref 6.0–8.5)
eGFR: 74 mL/min/{1.73_m2} (ref >59)

## 2021-11-04 LAB — LIPID PANEL
Chol/HDL Ratio: 3.4 ratio (ref 0.0–5.0)
Cholesterol, Total: 169 mg/dL (ref 100–199)
HDL: 50 mg/dL (ref >39)
LDL Chol Calc (NIH): 99 mg/dL (ref 0–99)
Triglycerides: 110 mg/dL (ref 0–149)
VLDL Cholesterol Cal: 20 mg/dL (ref 5–40)

## 2021-11-04 LAB — CBC WITH DIFFERENTIAL/PLATELET
Basophils Absolute: 0.1 10*3/uL (ref 0.0–0.2)
Basos: 1 %
EOS (ABSOLUTE): 0.3 10*3/uL (ref 0.0–0.4)
Eos: 4 %
Hematocrit: 45.6 % (ref 37.5–51.0)
Hemoglobin: 15.6 g/dL (ref 13.0–17.7)
Immature Grans (Abs): 0 10*3/uL (ref 0.0–0.1)
Immature Granulocytes: 0 %
Lymphocytes Absolute: 1.3 10*3/uL (ref 0.7–3.1)
Lymphs: 22 %
MCH: 29.7 pg (ref 26.6–33.0)
MCHC: 34.2 g/dL (ref 31.5–35.7)
MCV: 87 fL (ref 79–97)
Monocytes Absolute: 0.5 10*3/uL (ref 0.1–0.9)
Monocytes: 9 %
Neutrophils Absolute: 3.7 10*3/uL (ref 1.4–7.0)
Neutrophils: 64 %
Platelets: 215 10*3/uL (ref 150–450)
RBC: 5.25 x10E6/uL (ref 4.14–5.80)
RDW: 12.4 % (ref 11.6–15.4)
WBC: 5.9 10*3/uL (ref 3.4–10.8)

## 2021-11-04 LAB — CARDIOVASCULAR RISK ASSESSMENT

## 2021-11-07 LAB — SPECIMEN STATUS REPORT

## 2021-11-07 LAB — HGB A1C W/O EAG: Hgb A1c MFr Bld: 5.7 % — ABNORMAL HIGH (ref 4.8–5.6)

## 2021-11-26 ENCOUNTER — Other Ambulatory Visit: Payer: Self-pay | Admitting: Family Medicine

## 2021-11-28 NOTE — Telephone Encounter (Signed)
Refill sent to pharmacy.   

## 2021-12-26 ENCOUNTER — Other Ambulatory Visit: Payer: Self-pay

## 2021-12-26 MED ORDER — SILDENAFIL CITRATE 100 MG PO TABS: ORAL_TABLET | ORAL | 3 refills | Status: DC

## 2022-03-03 LAB — HM COLONOSCOPY

## 2022-08-10 ENCOUNTER — Other Ambulatory Visit: Payer: Self-pay | Admitting: Family Medicine

## 2022-08-10 DIAGNOSIS — E782 Mixed hyperlipidemia: Secondary | ICD-10-CM

## 2023-01-23 ENCOUNTER — Other Ambulatory Visit: Payer: Self-pay | Admitting: Family Medicine

## 2023-03-21 NOTE — Progress Notes (Unsigned)
Subjective:  Patient ID: Robert Arroyo, male    DOB: [birth year]  Age: 62 y.o. MRN: [id number]  Chief Complaint  Patient presents with   Annual Exam    HPI  Well Adult Physical: Patient here for a comprehensive physical exam.The patient reports {problems:16946} Do you take any herbs or supplements that were not prescribed by a doctor? {yes/no/not asked:9010} Are you taking calcium supplements? {yes/no:63} Are you taking aspirin daily? {yes/no:63}  Encounter for general adult medical examination without abnormal findings  Physical ("At Risk" items are starred): Patient's last physical exam was 1 year ago .  Patient wears a seat belt, has smoke detectors, has carbon monoxide detectors, practices appropriate gun safety, and wears sunscreen with extended sun exposure. Dental Care: biannual cleanings, brushes and flosses daily. Ophthalmology/Optometry: Annual visit.  Hearing loss: none Vision impairments: none Last PSA:     05/05/2021    8:01 AM 01/25/2021    7:41 AM 04/14/2020    2:09 PM  Depression screen PHQ 2/9  Decreased Interest 0 0 0  Down, Depressed, Hopeless 0 0 0  PHQ - 2 Score 0 0 0         01/26/2020   12:00 PM 01/27/2020    7:52 AM 04/14/2020    2:09 PM 01/25/2021    7:41 AM 05/05/2021    8:01 AM  Fall Risk  Falls in the past year?   0 0 0  Was there an injury with Fall?   0 0 0  Fall Risk Category Calculator   0 0 0  Fall Risk Category (Retired)   Low Low Low  (RETIRED) Patient Fall Risk Level High fall risk High fall risk Low fall risk Low fall risk Low fall risk  Patient at Risk for Falls Due to    No Fall Risks No Fall Risks  Fall risk Follow up    Falls evaluation completed Falls evaluation completed              Past Medical History:  Diagnosis Date   A-fib ([facility]) 2003   Closed fracture of multiple pubic rami ([facility]) 01/2020   COVID-19    Hyperlipidemia    Hypoxia    Pneumonia due to COVID-19 virus 11/06/2019   Right inguinal hernia    Past  Surgical History:  Procedure Laterality Date   CARDIAC CATHETERIZATION  2003    Family History  Problem Relation Age of Onset   Leukemia Mother    Glaucoma Father    Social History   Socioeconomic History   Marital status: Married    Spouse name: Not on file   Number of children: 0   Years of education: Not on file   Highest education level: Not on file  Occupational History   Not on file  Tobacco Use   Smoking status: Never   Smokeless tobacco: Never  Vaping Use   Vaping status: Never Used  Substance and Sexual Activity   Alcohol use: Yes    Comment: red wine rarely   Drug use: Never   Sexual activity: Not on file  Other Topics Concern   Not on file  Social History Narrative   Not on file   Social Determinants of Health   Financial Resource Strain: Not on file  Food Insecurity: Not on file  Transportation Needs: Not on file  Physical Activity: Not on file  Stress: Not on file  Social Connections: Not on file   Review of Systems   Objective:  There  were no vitals taken for this visit.     11/03/2021    7:26 AM 05/05/2021    7:44 AM 01/25/2021    7:39 AM  BP/Weight  Systolic BP 120 110 128  Diastolic BP 68 68 86  Wt. (Lbs) 178 183.6 186  BMI 29.17 kg/m2 28.76 kg/m2 29.13 kg/m2    Physical Exam  Lab Results  Component Value Date   WBC 5.9 11/03/2021   HGB 15.6 11/03/2021   HCT 45.6 11/03/2021   PLT 215 11/03/2021   GLUCOSE 117 (H) 11/03/2021   CHOL 169 11/03/2021   TRIG 110 11/03/2021   HDL 50 11/03/2021   LDLCALC 99 11/03/2021   ALT 35 11/03/2021   AST 27 11/03/2021   NA 141 11/03/2021   K 5.4 (H) 11/03/2021   CL 102 11/03/2021   CREATININE 1.12 11/03/2021   BUN 10 11/03/2021   CO2 24 11/03/2021   TSH 1.080 05/05/2021   INR 1.2 01/25/2020   HGBA1C 5.7 (H) 11/03/2021      Assessment & Plan:  Routine general medical examination at a health care facility     There is no height or weight on file to calculate BMI.   These are the  goals we discussed:  Goals   None      This is a list of the screening recommended for you and due dates:  Health Maintenance  Topic Date Due   Hepatitis C Screening  Never done   Colon Cancer Screening  09/30/2020   COVID-19 Vaccine (5 - 2023-24 season) 04/28/2022   Flu Shot  03/29/2023   DTaP/Tdap/Td vaccine (3 - Td or Tdap) 01/24/2030   HIV Screening  Completed   Zoster (Shingles) Vaccine  Completed   HPV Vaccine  Aged Out     No orders of the defined types were placed in this encounter.    Follow-up: No follow-ups on file.  An After Visit Summary was printed and given to the patient.  Langley Gauss, [facility] [phone]

## 2023-03-22 ENCOUNTER — Encounter: Payer: Self-pay | Admitting: Physician Assistant

## 2023-03-22 ENCOUNTER — Ambulatory Visit (INDEPENDENT_AMBULATORY_CARE_PROVIDER_SITE_OTHER): Payer: BC Managed Care – PPO | Admitting: Physician Assistant

## 2023-03-22 VITALS — BP 110/60 | HR 64 | Temp 97.0°F | Resp 18 | Ht 67.0 in | Wt 188.8 lb

## 2023-03-22 DIAGNOSIS — E782 Mixed hyperlipidemia: Secondary | ICD-10-CM

## 2023-03-22 DIAGNOSIS — Z125 Encounter for screening for malignant neoplasm of prostate: Secondary | ICD-10-CM | POA: Diagnosis not present

## 2023-03-22 DIAGNOSIS — Z0000 Encounter for general adult medical examination without abnormal findings: Secondary | ICD-10-CM

## 2023-03-22 MED ORDER — ROSUVASTATIN CALCIUM 20 MG PO TABS: 20.0000 mg | ORAL_TABLET | Freq: Every day | ORAL | 1 refills | Status: DC

## 2023-03-22 MED ORDER — METOPROLOL SUCCINATE ER 25 MG PO TB24: 25.0000 mg | ORAL_TABLET | Freq: Every day | ORAL | 3 refills | Status: DC

## 2023-03-23 LAB — COMPREHENSIVE METABOLIC PANEL: Glucose: 104 mg/dL — ABNORMAL HIGH (ref 70–99)

## 2023-12-17 ENCOUNTER — Other Ambulatory Visit: Payer: Self-pay | Admitting: Physician Assistant

## 2023-12-17 DIAGNOSIS — E782 Mixed hyperlipidemia: Secondary | ICD-10-CM

## 2024-03-25 ENCOUNTER — Encounter: Payer: Self-pay | Admitting: Physician Assistant

## 2024-03-25 ENCOUNTER — Ambulatory Visit (INDEPENDENT_AMBULATORY_CARE_PROVIDER_SITE_OTHER): Payer: BC Managed Care – PPO | Admitting: Physician Assistant

## 2024-03-25 ENCOUNTER — Other Ambulatory Visit: Payer: Self-pay | Admitting: Physician Assistant

## 2024-03-25 VITALS — BP 104/60 | HR 51 | Temp 98.0°F | Resp 16 | Ht 67.0 in | Wt 196.6 lb

## 2024-03-25 DIAGNOSIS — Z13220 Encounter for screening for lipoid disorders: Secondary | ICD-10-CM

## 2024-03-25 DIAGNOSIS — E782 Mixed hyperlipidemia: Secondary | ICD-10-CM

## 2024-03-25 DIAGNOSIS — Z136 Encounter for screening for cardiovascular disorders: Secondary | ICD-10-CM

## 2024-03-25 DIAGNOSIS — K469 Unspecified abdominal hernia without obstruction or gangrene: Secondary | ICD-10-CM | POA: Insufficient documentation

## 2024-03-25 DIAGNOSIS — Z0000 Encounter for general adult medical examination without abnormal findings: Secondary | ICD-10-CM

## 2024-03-25 DIAGNOSIS — K439 Ventral hernia without obstruction or gangrene: Secondary | ICD-10-CM

## 2024-03-25 DIAGNOSIS — Z125 Encounter for screening for malignant neoplasm of prostate: Secondary | ICD-10-CM

## 2024-03-25 NOTE — Assessment & Plan Note (Signed)
 Managed with rosuvastatin . No new concerns. - Ensure rosuvastatin  prescription has sufficient refills.

## 2024-03-25 NOTE — Progress Notes (Signed)
 Subjective:  Patient ID: Robert Arroyo, male    DOB: March 15, 1961  Age: 63 y.o. MRN: [id number]  Chief Complaint  Patient presents with   Annual Exam   HPI:  Well Adult Physical: Patient here for a comprehensive physical exam.The patient reports no problems Do you take any herbs or supplements that were not prescribed by a doctor? yes Are you taking calcium  supplements? no Are you taking aspirin daily? no  Encounter for general adult medical examination without abnormal findings  Physical (At Risk items are starred): Patient's last physical exam was 1 year ago .  Patient wears a seat belt, has smoke detectors, has carbon monoxide detectors, practices appropriate gun safety, and wears sunscreen with extended sun exposure. Dental Care: biannual cleanings, brushes and flosses daily. Ophthalmology/Optometry: Annual visit.  Hearing loss: none Vision impairments: none Last PSA: 02/2023: 2.4     03/25/2024    8:16 AM 03/22/2023    8:42 AM 05/05/2021    8:01 AM 01/25/2021    7:41 AM 04/14/2020    2:09 PM  Depression screen PHQ 2/9  Decreased Interest 0 0 0 0 0  Down, Depressed, Hopeless 0 0 0 0 0  PHQ - 2 Score 0 0 0 0 0  Altered sleeping 0 0     Tired, decreased energy 0 0     Change in appetite 0 0     Feeling bad or failure about yourself  0 0     Trouble concentrating 0 0     Moving slowly or fidgety/restless 0 0     Suicidal thoughts 0 0     PHQ-9 Score 0 0     Difficult doing work/chores Not difficult at all Not difficult at all            04/14/2020    2:09 PM 01/25/2021    7:41 AM 05/05/2021    8:01 AM 03/22/2023    8:42 AM 03/25/2024    8:16 AM  Fall Risk  Falls in the past year? 0 0 0 0 0  Was there an injury with Fall? 0 0 0 0 0  Fall Risk Category Calculator 0 0 0 0 0  Fall Risk Category (Retired) Low  Low  Low     (RETIRED) Patient Fall Risk Level Low fall risk  Low fall risk  Low fall risk     Patient at Risk for Falls Due to  No Fall Risks No Fall Risks  No Fall Risks No Fall Risks  Fall risk Follow up  Falls evaluation completed  Falls evaluation completed  Falls evaluation completed;Falls prevention discussed Falls evaluation completed     Data saved with a previous flowsheet row definition              Past Medical History:  Diagnosis Date   A-fib [facility]) 2003   Closed fracture of multiple pubic rami (HCC) 01/2020   COVID-19    Hyperlipidemia    Hypoxia    Pneumonia due to COVID-19 virus 11/06/2019   Right inguinal hernia    Past Surgical History:  Procedure Laterality Date   CARDIAC CATHETERIZATION  2003    Family History  Problem Relation Age of Onset   Leukemia Mother    Glaucoma Father    Social History   Socioeconomic History   Marital status: Married    Spouse name: Not on file   Number of children: 0   Years of education: Not on file   Highest education  level: Not on file  Occupational History   Not on file  Tobacco Use   Smoking status: Never   Smokeless tobacco: Never  Vaping Use   Vaping status: Never Used  Substance and Sexual Activity   Alcohol use: Yes    Comment: red wine rarely   Drug use: Never   Sexual activity: Not on file  Other Topics Concern   Not on file  Social History Narrative   Not on file   Social Drivers of Health   Financial Resource Strain: Low Risk  (03/22/2023)   Overall Financial Resource Strain (CARDIA)    Difficulty of Paying Living Expenses: Not hard at all  Food Insecurity: No Food Insecurity (03/22/2023)   Hunger Vital Sign    Worried About Running Out of Food in the Last Year: Never true    Ran Out of Food in the Last Year: Never true  Transportation Needs: No Transportation Needs (03/22/2023)   PRAPARE - Administrator, Civil Service (Medical): No    Lack of Transportation (Non-Medical): No  Physical Activity: Sufficiently Active (03/22/2023)   Exercise Vital Sign    Days of Exercise per Week: 6 days    Minutes of Exercise per Session: 40 min   Stress: No Stress Concern Present (03/22/2023)   [employer] of Occupational Health - Occupational Stress Questionnaire    Feeling of Stress : Not at all  Social Connections: Socially Integrated (03/22/2023)   Social Connection and Isolation Panel    Frequency of Communication with Friends and Family: More than three times a week    Frequency of Social Gatherings with Friends and Family: Twice a week    Attends Religious Services: More than 4 times per year    Active Member of [employer] or Organizations: No    Attends Engineer, structural: More than 4 times per year    Marital Status: Married   Review of Systems  Constitutional:  Negative for appetite change, fatigue and fever.  HENT:  Negative for congestion, ear pain, sinus pressure and sore throat.   Eyes: Negative.   Respiratory:  Negative for cough, chest tightness, shortness of breath and wheezing.   Cardiovascular:  Negative for chest pain and palpitations.  Gastrointestinal:  Negative for abdominal pain, constipation, diarrhea, nausea and vomiting.  Endocrine: Negative.   Genitourinary:  Negative for dysuria and hematuria.  Musculoskeletal:  Negative for arthralgias, back pain, joint swelling and myalgias.  Skin:  Negative for rash.  Allergic/Immunologic: Negative.   Neurological:  Negative for dizziness, weakness and headaches.  Hematological: Negative.   Psychiatric/Behavioral:  Negative for dysphoric mood. The patient is not nervous/anxious.      Objective:  BP 104/60   Pulse (!) 51   Temp 98 F (36.7 C) (Temporal)   Resp 16   Ht 5' 7 (1.702 m)   Wt 196 lb 9.6 oz (89.2 kg)   SpO2 97%   BMI 30.79 kg/m      03/25/2024    8:12 AM 03/22/2023    8:40 AM 11/03/2021    7:26 AM  BP/Weight  Systolic BP 104 110 120  Diastolic BP 60 60 68  Wt. (Lbs) 196.6 188.8 178  BMI 30.79 kg/m2 29.57 kg/m2 29.17 kg/m2    Physical Exam Constitutional:      Appearance: Normal appearance.  HENT:     Right Ear:  Tympanic membrane normal.     Left Ear: Tympanic membrane normal.     Nose: Nose normal.  Mouth/Throat:     Pharynx: No oropharyngeal exudate or posterior oropharyngeal erythema.  Eyes:     Conjunctiva/sclera: Conjunctivae normal.  Neck:     Vascular: No carotid bruit.  Cardiovascular:     Rate and Rhythm: Normal rate and regular rhythm.     Heart sounds: Normal heart sounds.  Pulmonary:     Effort: Pulmonary effort is normal.     Breath sounds: Normal breath sounds.  Abdominal:     General: Bowel sounds are normal.     Palpations: Abdomen is soft.     Tenderness: There is no abdominal tenderness.     Hernia: A hernia is present.     Comments: Possible abdominal Hernia. No discoloration, pain. Easily reducible. Continue to monitor  Skin:    Findings: No lesion or rash.  Neurological:     Mental Status: He is alert and oriented to person, place, and time.  Psychiatric:        Behavior: Behavior normal.     Lab Results  Component Value Date   WBC 5.5 03/22/2023   HGB 15.5 03/22/2023   HCT 45.1 03/22/2023   PLT 193 03/22/2023   GLUCOSE 104 (H) 03/22/2023   CHOL 158 03/22/2023   TRIG 118 03/22/2023   HDL 43 03/22/2023   LDLCALC 94 03/22/2023   ALT 30 03/22/2023   AST 28 03/22/2023   NA 141 03/22/2023   K 4.7 03/22/2023   CL 104 03/22/2023   CREATININE 1.04 03/22/2023   BUN 11 03/22/2023   CO2 24 03/22/2023   TSH 1.080 05/05/2021   INR 1.2 01/25/2020   HGBA1C 5.7 (H) 11/03/2021      Assessment & Plan:  Routine general medical examination at a health care facility -     CBC with Differential/Platelet -     Comprehensive metabolic panel with GFR  Prostate cancer screening -     PSA  Mixed hyperlipidemia Assessment & Plan: Managed with rosuvastatin . No new concerns. - Ensure rosuvastatin  prescription has sufficient refills.  Orders: -     Lipid panel  Encounter for lipid screening for cardiovascular disease -     Hemoglobin A1c  Ventral hernia  without obstruction or gangrene Assessment & Plan: Possible new hernia, asymptomatic. - Monitor hernia for any increase in size or symptoms. - Refer to general surgeon if hernia enlarges or becomes symptomatic.          Body mass index is 30.79 kg/m.   These are the goals we discussed:  Goals   None      This is a list of the screening recommended for you and due dates:  Health Maintenance  Topic Date Due   COVID-19 Vaccine (5 - 2024-25 season) 04/10/2024*   Flu Shot  03/28/2024   DTaP/Tdap/Td vaccine (3 - Td or Tdap) 01/24/2030   Colon Cancer Screening  03/03/2032   Hepatitis C Screening  Completed   HIV Screening  Completed   Zoster (Shingles) Vaccine  Completed   Hepatitis B Vaccine  Aged Out   HPV Vaccine  Aged Out   Meningitis B Vaccine  Aged Out  *Topic was postponed. The date shown is not the original due date.     No orders of the defined types were placed in this encounter.    Follow-up: Return in about 1 year (around 03/25/2025) for cpe, Nola.  An After Visit Summary was printed and given to the patient.  Nola Angles, [place] [facility] [phone]

## 2024-03-25 NOTE — Assessment & Plan Note (Signed)
 Possible new hernia, asymptomatic. - Monitor hernia for any increase in size or symptoms. - Refer to general surgeon if hernia enlarges or becomes symptomatic.

## 2024-03-26 ENCOUNTER — Ambulatory Visit: Payer: Self-pay | Admitting: Physician Assistant

## 2024-03-26 LAB — CBC WITH DIFFERENTIAL/PLATELET
Basophils Absolute: 0.1 x10E3/uL (ref 0.0–0.2)
Basos: 1 %
EOS (ABSOLUTE): 0.4 x10E3/uL (ref 0.0–0.4)
Eos: 5 %
Hematocrit: 45.2 % (ref 37.5–51.0)
Hemoglobin: 15.2 g/dL (ref 13.0–17.7)
Immature Grans (Abs): 0 x10E3/uL (ref 0.0–0.1)
Immature Granulocytes: 0 %
Lymphocytes Absolute: 1.6 x10E3/uL (ref 0.7–3.1)
Lymphs: 23 %
MCH: 30.3 pg (ref 26.6–33.0)
MCHC: 33.6 g/dL (ref 31.5–35.7)
MCV: 90 fL (ref 79–97)
Monocytes Absolute: 0.8 x10E3/uL (ref 0.1–0.9)
Monocytes: 11 %
Neutrophils Absolute: 4.2 x10E3/uL (ref 1.4–7.0)
Neutrophils: 59 %
Platelets: 198 x10E3/uL (ref 150–450)
RBC: 5.02 x10E6/uL (ref 4.14–5.80)
RDW: 13.2 % (ref 11.6–15.4)
WBC: 7.1 x10E3/uL (ref 3.4–10.8)

## 2024-03-26 LAB — LIPID PANEL
Chol/HDL Ratio: 3.6 ratio (ref 0.0–5.0)
Cholesterol, Total: 152 mg/dL (ref 100–199)
HDL: 42 mg/dL (ref >39)
LDL Chol Calc (NIH): 85 mg/dL (ref 0–99)
Triglycerides: 143 mg/dL (ref 0–149)
VLDL Cholesterol Cal: 25 mg/dL (ref 5–40)

## 2024-03-26 LAB — HEMOGLOBIN A1C
Est. average glucose Bld gHb Est-mCnc: 108 mg/dL
Hgb A1c MFr Bld: 5.4 % (ref 4.8–5.6)

## 2024-03-26 LAB — COMPREHENSIVE METABOLIC PANEL WITH GFR
ALT: 27 IU/L (ref 0–44)
AST: 26 IU/L (ref 0–40)
Albumin: 4.4 g/dL (ref 3.9–4.9)
Alkaline Phosphatase: 91 IU/L (ref 44–121)
BUN/Creatinine Ratio: 12 (ref 10–24)
BUN: 15 mg/dL (ref 8–27)
Bilirubin Total: 1.2 mg/dL (ref 0.0–1.2)
CO2: 21 mmol/L (ref 20–29)
Calcium: 9.5 mg/dL (ref 8.6–10.2)
Chloride: 100 mmol/L (ref 96–106)
Creatinine, Ser: 1.22 mg/dL (ref 0.76–1.27)
Globulin, Total: 2.2 g/dL (ref 1.5–4.5)
Glucose: 105 mg/dL — ABNORMAL HIGH (ref 70–99)
Potassium: 4.4 mmol/L (ref 3.5–5.2)
Sodium: 136 mmol/L (ref 134–144)
Total Protein: 6.6 g/dL (ref 6.0–8.5)
eGFR: 66 mL/min/1.73 (ref >59)

## 2024-03-26 LAB — PSA: Prostate Specific Ag, Serum: 2.7 ng/mL (ref 0.0–4.0)

## 2025-03-26 ENCOUNTER — Encounter: Admitting: Physician Assistant
